# Patient Record
Sex: Female | Born: 1995 | Race: Black or African American | Hispanic: No | Marital: Single | Smoking: Never smoker
Health system: Southern US, Community
[De-identification: ages and names within clinical notes are randomized; demographics above are authoritative.]

## PROBLEM LIST (undated history)

## (undated) ENCOUNTER — Inpatient Hospital Stay (HOSPITAL_COMMUNITY): Payer: Self-pay

---

## 2021-05-12 LAB — OB RESULTS CONSOLE GBS: GBS: NEGATIVE

## 2021-05-12 LAB — OB RESULTS CONSOLE RPR: RPR: NONREACTIVE

## 2021-05-12 LAB — OB RESULTS CONSOLE HEPATITIS B SURFACE ANTIGEN: Hepatitis B Surface Ag: NEGATIVE

## 2021-05-12 LAB — OB RESULTS CONSOLE RUBELLA ANTIBODY, IGM: Rubella: IMMUNE

## 2021-05-12 LAB — OB RESULTS CONSOLE HIV ANTIBODY (ROUTINE TESTING): HIV: NONREACTIVE

## 2021-05-12 LAB — HIV-1 RNA, QUALITATIVE, TMA: HIV-1 RNA, Qualitative, TMA: NEGATIVE

## 2021-06-25 ENCOUNTER — Emergency Department (HOSPITAL_COMMUNITY): Payer: Commercial Managed Care - PPO

## 2021-06-25 ENCOUNTER — Emergency Department (HOSPITAL_COMMUNITY)
Admission: EM | Admit: 2021-06-25 | Discharge: 2021-06-26 | Disposition: A | Discharge status: Home / Self Care | Payer: Commercial Managed Care - PPO | Attending: Emergency Medicine | Admitting: Emergency Medicine

## 2021-06-25 ENCOUNTER — Encounter (HOSPITAL_COMMUNITY): Payer: Self-pay

## 2021-06-25 ENCOUNTER — Other Ambulatory Visit: Payer: Self-pay

## 2021-06-25 DIAGNOSIS — O4690 Antepartum hemorrhage, unspecified, unspecified trimester: Secondary | ICD-10-CM

## 2021-06-25 DIAGNOSIS — N939 Abnormal uterine and vaginal bleeding, unspecified: Secondary | ICD-10-CM | POA: Diagnosis not present

## 2021-06-25 DIAGNOSIS — N898 Other specified noninflammatory disorders of vagina: Secondary | ICD-10-CM | POA: Diagnosis not present

## 2021-06-25 DIAGNOSIS — Z3A01 Less than 8 weeks gestation of pregnancy: Secondary | ICD-10-CM | POA: Insufficient documentation

## 2021-06-25 DIAGNOSIS — O26891 Other specified pregnancy related conditions, first trimester: Secondary | ICD-10-CM | POA: Diagnosis present

## 2021-06-25 LAB — BASIC METABOLIC PANEL
Anion gap: 9 (ref 5–15)
BUN: 9 mg/dL (ref 6–20)
CO2: 23 mmol/L (ref 22–32)
Calcium: 9.5 mg/dL (ref 8.9–10.3)
Chloride: 105 mmol/L (ref 98–111)
Creatinine, Ser: 0.47 mg/dL (ref 0.44–1.00)
GFR, Estimated: >60 mL/min (ref >60)
Glucose, Bld: 95 mg/dL (ref 70–99)
Potassium: 3.7 mmol/L (ref 3.5–5.1)
Sodium: 137 mmol/L (ref 135–145)

## 2021-06-25 LAB — CBC WITH DIFFERENTIAL/PLATELET
Abs Immature Granulocytes: 0.02 10*3/uL (ref 0.00–0.07)
Basophils Absolute: 0 10*3/uL (ref 0.0–0.1)
Basophils Relative: 0 %
Eosinophils Absolute: 0.1 10*3/uL (ref 0.0–0.5)
Eosinophils Relative: 1 %
HCT: 34.8 % — ABNORMAL LOW (ref 36.0–46.0)
Hemoglobin: 11.7 g/dL — ABNORMAL LOW (ref 12.0–15.0)
Immature Granulocytes: 0 %
Lymphocytes Relative: 32 %
Lymphs Abs: 2.8 10*3/uL (ref 0.7–4.0)
MCH: 30.9 pg (ref 26.0–34.0)
MCHC: 33.6 g/dL (ref 30.0–36.0)
MCV: 91.8 fL (ref 80.0–100.0)
Monocytes Absolute: 0.5 10*3/uL (ref 0.1–1.0)
Monocytes Relative: 6 %
Neutro Abs: 5.2 10*3/uL (ref 1.7–7.7)
Neutrophils Relative %: 61 %
Platelets: 284 10*3/uL (ref 150–400)
RBC: 3.79 MIL/uL — ABNORMAL LOW (ref 3.87–5.11)
RDW: 12.8 % (ref 11.5–15.5)
WBC: 8.6 10*3/uL (ref 4.0–10.5)
nRBC: 0 % (ref 0.0–0.2)

## 2021-06-25 LAB — HCG, QUANTITATIVE, PREGNANCY: hCG, Beta Chain, Quant, S: 23202 m[IU]/mL — ABNORMAL HIGH (ref <5)

## 2021-06-25 LAB — PREGNANCY, URINE: Preg Test, Ur: POSITIVE — AB

## 2021-06-25 NOTE — ED Triage Notes (Signed)
Pt reports being [redacted] weeks pregnant with bright red bleeding today when she went to the bathroom. Pt reports having minimal cramping and back pain.

## 2021-06-25 NOTE — ED Provider Notes (Signed)
Emergency Medicine Provider Triage Evaluation Note  [facility] , a 25 y.o. female  was evaluated in triage.  Pt complains of vaginal bleeding and pelvic pain.  Patient reports she is approximately [redacted] weeks pregnant and today began experiencing vaginal bleeding that started as spotting but became heavier throughout the day associated with cramping in her lower abdomen and low back.  Reports history of 1 prior miscarriage which felt very similar.  No lightheadedness or syncope.  Review of Systems  Positive: Vaginal bleeding, abdominal pain, pelvic pain Negative: Fever, syncope  Physical Exam  BP 129/77 (BP Location: Left Arm)   Pulse 69   Temp 98.2 F (36.8 C) (Oral)   Resp 16   Ht 5' 6.5" (1.689 m)   Wt 84.4 kg   SpO2 100%   BMI 29.57 kg/m  Gen:   Awake, no distress   Resp:  Normal effort  MSK:   Moves extremities without difficulty  Other:    Medical Decision Making  Medically screening exam initiated at 8:27 PM.  Appropriate orders placed.  Robin Nelson was informed that the remainder of the evaluation will be completed by another provider, this initial triage assessment does not replace that evaluation, and the importance of remaining in the ED until their evaluation is complete.     Legrand Rams 06/25/21 2042    Wynetta Fines, MD 06/27/21 1452

## 2021-06-26 LAB — URINALYSIS, ROUTINE W REFLEX MICROSCOPIC
Bilirubin Urine: NEGATIVE
Glucose, UA: NEGATIVE mg/dL
Hgb urine dipstick: NEGATIVE
Ketones, ur: NEGATIVE mg/dL
Leukocytes,Ua: NEGATIVE
Nitrite: NEGATIVE
Protein, ur: NEGATIVE mg/dL
Specific Gravity, Urine: 1.02 (ref 1.005–1.030)
pH: 5 (ref 5.0–8.0)

## 2021-06-26 LAB — ABO/RH: ABO/RH(D): B POS

## 2021-06-26 NOTE — Discharge Instructions (Addendum)
Your urinalysis did not suggest a urinary tract infection.  Your ultrasound showed an intrauterine pregnancy of 6 weeks and 1 day.  A fetal heart rate was present.  We recommend that you continue taking a prenatal vitamin and schedule follow-up with an OB/GYN for ongoing prenatal care.  Avoid the use of NSAIDs in pregnancy such as Advil, Motrin, Aleve.  You may use Tylenol for management of any abdominal cramping.   Return to the ED for new or concerning symptoms.

## 2021-06-26 NOTE — ED Provider Notes (Signed)
[facility]-EMERGENCY DEPT Provider Note   CSN: [id number] Arrival date & time: 06/25/21  1934     History Chief Complaint  Patient presents with   Threatened Miscarriage    Elba Schaber is a 25 y.o. female.  25 year old G25P0 female with LMP of 05/12/2021 presents to the emergency department for evaluation of abdominal cramping.  Cramping present in her lower abdomen and back.  Symptoms noted to be constant, but have been spontaneously improving.  During my assessment, the patient reports that her discomfort has completely resolved.  She did notice a scant amount of vaginal discharge characterized as blood mixed with mucus.  Her spotting has been sporadic, noticed mostly when wiping with the toilet tissue.  She has been having some urinary frequency, but no dysuria.  Denies fever, lightheadedness, syncope.  The history is provided by the patient. No language interpreter was used.      History reviewed. No pertinent past medical history.  There are no problems to display for this patient.   ** The histories are not reviewed yet. Please review them in the "History" navigator section and refresh this SmartLink.   OB History   No obstetric history on file.     History reviewed. No pertinent family history.     Home Medications Prior to Admission medications   Not on File    Allergies    Patient has no allergy information on record.  Review of Systems   Review of Systems Ten systems reviewed and are negative for acute change, except as noted in the HPI.    Physical Exam Updated Vital Signs BP 129/77 (BP Location: Left Arm)   Pulse 69   Temp 98.2 F (36.8 C) (Oral)   Resp 16   Ht 5' 6.5" (1.689 m)   Wt 84.4 kg   SpO2 100%   BMI 29.57 kg/m   Physical Exam Vitals and nursing note reviewed.  Constitutional:      General: She is not in acute distress.    Appearance: She is well-developed. She is not diaphoretic.     Comments: Nontoxic  appearing and in NAD  HENT:     Head: Normocephalic and atraumatic.  Eyes:     General: No scleral icterus.    Conjunctiva/sclera: Conjunctivae normal.  Pulmonary:     Effort: Pulmonary effort is normal. No respiratory distress.     Comments: Respirations even and unlabored Abdominal:     General: There is no distension.  Musculoskeletal:        General: Normal range of motion.     Cervical back: Normal range of motion.  Skin:    General: Skin is warm and dry.     Coloration: Skin is not pale.     Findings: No erythema or rash.  Neurological:     Mental Status: She is alert and oriented to person, place, and time.     Coordination: Coordination normal.  Psychiatric:        Behavior: Behavior normal.    ED Results / Procedures / Treatments   Labs (all labs ordered are listed, but only abnormal results are displayed) Labs Reviewed  CBC WITH DIFFERENTIAL/PLATELET - Abnormal; Notable for the following components:      Result Value   RBC 3.79 (*)    Hemoglobin 11.7 (*)    HCT 34.8 (*)    All other components within normal limits  HCG, QUANTITATIVE, PREGNANCY - Abnormal; Notable for the following components:   hCG, Beta Chain,  Quant, S 23,202 (*)    All other components within normal limits  PREGNANCY, URINE - Abnormal; Notable for the following components:   Preg Test, Ur POSITIVE (*)    All other components within normal limits  BASIC METABOLIC PANEL  URINALYSIS, ROUTINE W REFLEX MICROSCOPIC  ABO/RH    EKG None  Radiology US OB Comp < 14 Wks  Result Date: 06/26/2021 CLINICAL DATA:  Pregnant, vaginal bleeding, LMP 05/12/2021 EXAM: OBSTETRIC <14 WK [place] AND TRANSVAGINAL OB US TECHNIQUE: Both transabdominal and transvaginal ultrasound examinations were performed for complete evaluation of the gestation as well as the maternal uterus, adnexal regions, and pelvic cul-de-sac. Transvaginal technique was performed to assess early pregnancy. COMPARISON:  None. FINDINGS:  Intrauterine gestational sac: Present, single. The gestational sac is seen within the lateral, superior aspect of the endometrial cavity compatible with an angular pregnancy. Yolk sac:  Present, single, normal appearing Embryo:  Present, single Cardiac Activity: Present, regular Heart Rate: 95 bpm MSD: Appropriate given fetal size CRL:  4 mm   6 w   1 d                  [place] EDC: 02/18/2022 Subchorionic hemorrhage:  None visualized. Maternal uterus/adnexae: The uterus is anteverted. No intrauterine masses are seen. The cervix is not specifically evaluated on on this exam. The left ovary is not visualized. A corpus luteum is identified within the right ovary. No free fluid is seen within the cul-de-sac. IMPRESSION: Single living intrauterine gestation with an estimated gestational age of [redacted] weeks, 1 day. Gestational sac position within the superolateral aspect of the endometrial cavity, which appears normal in configuration, compatible with an angular pregnancy. Electronically Signed   By: Helyn Numbers M.D.   On: 06/26/2021 00:15   US OB Transvaginal  Result Date: 06/26/2021 CLINICAL DATA:  Pregnant, vaginal bleeding, LMP 05/12/2021 EXAM: OBSTETRIC <14 WK [place] AND TRANSVAGINAL OB US TECHNIQUE: Both transabdominal and transvaginal ultrasound examinations were performed for complete evaluation of the gestation as well as the maternal uterus, adnexal regions, and pelvic cul-de-sac. Transvaginal technique was performed to assess early pregnancy. COMPARISON:  None. FINDINGS: Intrauterine gestational sac: Present, single. The gestational sac is seen within the lateral, superior aspect of the endometrial cavity compatible with an angular pregnancy. Yolk sac:  Present, single, normal appearing Embryo:  Present, single Cardiac Activity: Present, regular Heart Rate: 95 bpm MSD: Appropriate given fetal size CRL:  4 mm   6 w   1 d                  [place] EDC: 02/18/2022 Subchorionic hemorrhage:  None visualized. Maternal  uterus/adnexae: The uterus is anteverted. No intrauterine masses are seen. The cervix is not specifically evaluated on on this exam. The left ovary is not visualized. A corpus luteum is identified within the right ovary. No free fluid is seen within the cul-de-sac. IMPRESSION: Single living intrauterine gestation with an estimated gestational age of [redacted] weeks, 1 day. Gestational sac position within the superolateral aspect of the endometrial cavity, which appears normal in configuration, compatible with an angular pregnancy. Electronically Signed   By: Helyn Numbers M.D.   On: 06/26/2021 00:15    Procedures Procedures   Medications Ordered in ED Medications - No data to display  ED Course  I have reviewed the triage vital signs and the nursing notes.  Pertinent labs & imaging results that were available during my care of the patient were reviewed by me and considered  in my medical decision making (see chart for details).    MDM Rules/Calculators/A&P                           25 year old female with vaginal discharge in early pregnancy.  Discharge characterized as mucus mixed with some bright red blood.  Also noting some lower abdominal cramping which has spontaneously resolved.  Her laboratory evaluation has been reassuring and pelvic ultrasound shows single live intrauterine gestation of approximately 6 weeks, 1 day.  Fetal heart rate noted.  Encouraged close OBGYN for prenatal care.  Return precautions discussed and provided. Patient discharged in stable condition with no unaddressed concerns.   Final Clinical Impression(s) / ED Diagnoses Final diagnoses:  Vaginal bleeding in pregnancy    Rx / DC Orders ED Discharge Orders     None        Antony Madura, PA-C 06/26/21 0136    Palumbo, April, MD 06/26/21 [phone]

## 2021-09-16 ENCOUNTER — Other Ambulatory Visit: Payer: Self-pay

## 2021-09-16 ENCOUNTER — Emergency Department (HOSPITAL_COMMUNITY)
Admission: EM | Admit: 2021-09-16 | Discharge: 2021-09-17 | Disposition: A | Discharge status: Home / Self Care | Payer: Commercial Managed Care - PPO | Attending: Emergency Medicine | Admitting: Emergency Medicine

## 2021-09-16 DIAGNOSIS — Z3A18 18 weeks gestation of pregnancy: Secondary | ICD-10-CM | POA: Diagnosis not present

## 2021-09-16 DIAGNOSIS — O26892 Other specified pregnancy related conditions, second trimester: Secondary | ICD-10-CM | POA: Diagnosis present

## 2021-09-16 DIAGNOSIS — R42 Dizziness and giddiness: Secondary | ICD-10-CM | POA: Insufficient documentation

## 2021-09-16 DIAGNOSIS — Z20822 Contact with and (suspected) exposure to covid-19: Secondary | ICD-10-CM | POA: Diagnosis not present

## 2021-09-16 LAB — CBC WITH DIFFERENTIAL/PLATELET
Abs Immature Granulocytes: 0.04 10*3/uL (ref 0.00–0.07)
Basophils Absolute: 0 10*3/uL (ref 0.0–0.1)
Basophils Relative: 0 %
Eosinophils Absolute: 0 10*3/uL (ref 0.0–0.5)
Eosinophils Relative: 1 %
HCT: 28.4 % — ABNORMAL LOW (ref 36.0–46.0)
Hemoglobin: 9.7 g/dL — ABNORMAL LOW (ref 12.0–15.0)
Immature Granulocytes: 1 %
Lymphocytes Relative: 25 %
Lymphs Abs: 1.7 10*3/uL (ref 0.7–4.0)
MCH: 30.7 pg (ref 26.0–34.0)
MCHC: 34.2 g/dL (ref 30.0–36.0)
MCV: 89.9 fL (ref 80.0–100.0)
Monocytes Absolute: 0.5 10*3/uL (ref 0.1–1.0)
Monocytes Relative: 7 %
Neutro Abs: 4.7 10*3/uL (ref 1.7–7.7)
Neutrophils Relative %: 66 %
Platelets: 211 10*3/uL (ref 150–400)
RBC: 3.16 MIL/uL — ABNORMAL LOW (ref 3.87–5.11)
RDW: 13.4 % (ref 11.5–15.5)
WBC: 7 10*3/uL (ref 4.0–10.5)
nRBC: 0 % (ref 0.0–0.2)

## 2021-09-16 NOTE — ED Triage Notes (Signed)
Pt complains of dizziness for the past few days. Pt reports being [redacted] weeks pregnant.

## 2021-09-16 NOTE — ED Provider Notes (Signed)
Emergency Medicine Provider Triage Evaluation Note  [facility] , a 25 y.o. female  was evaluated in triage.  Pt complains of dizziness described as feeling like her body is moving, occurs while at rest as well as with activity. No recent falls or injuries. Reports urinary frequency without dysuria, attributes frequency to pregnancy (18 weeks, no leaking fluids or bleeding). OB confirmed pregnancy, normal [place] with IUP. G2P1 (blighted ovum at 7 wks with first pregnancy).  Review of Systems  Positive: dizziness Negative: Vomiting, changes in bowel habits, weakness, numbness   Physical Exam  BP 122/81   Pulse 88   Temp 98.9 F (37.2 C) (Oral)   Resp 18   SpO2 100%  Gen:   Awake, no distress   Resp:  Normal effort  MSK:   Moves extremities without difficulty  Other:  Negative Romberg, neuro exam normal  Medical Decision Making  Medically screening exam initiated at 10:28 PM.  Appropriate orders placed.  Miabella Shannahan was informed that the remainder of the evaluation will be completed by another provider, this initial triage assessment does not replace that evaluation, and the importance of remaining in the ED until their evaluation is complete.     Jeannie Fend, PA-C 09/19/21 1734    Mancel Bale, MD 09/22/21 [phone]

## 2021-09-17 LAB — URINALYSIS, ROUTINE W REFLEX MICROSCOPIC
Bilirubin Urine: NEGATIVE
Glucose, UA: NEGATIVE mg/dL
Hgb urine dipstick: NEGATIVE
Ketones, ur: 5 mg/dL — AB
Leukocytes,Ua: NEGATIVE
Nitrite: NEGATIVE
Protein, ur: NEGATIVE mg/dL
Specific Gravity, Urine: 1.024 (ref 1.005–1.030)
pH: 6 (ref 5.0–8.0)

## 2021-09-17 LAB — COMPREHENSIVE METABOLIC PANEL
ALT: 24 U/L (ref 0–44)
AST: 19 U/L (ref 15–41)
Albumin: 3.6 g/dL (ref 3.5–5.0)
Alkaline Phosphatase: 58 U/L (ref 38–126)
Anion gap: 7 (ref 5–15)
BUN: 9 mg/dL (ref 6–20)
CO2: 21 mmol/L — ABNORMAL LOW (ref 22–32)
Calcium: 9.2 mg/dL (ref 8.9–10.3)
Chloride: 107 mmol/L (ref 98–111)
Creatinine, Ser: 0.37 mg/dL — ABNORMAL LOW (ref 0.44–1.00)
GFR, Estimated: >60 mL/min (ref >60)
Glucose, Bld: 87 mg/dL (ref 70–99)
Potassium: 3.6 mmol/L (ref 3.5–5.1)
Sodium: 135 mmol/L (ref 135–145)
Total Bilirubin: 0.3 mg/dL (ref 0.3–1.2)
Total Protein: 7 g/dL (ref 6.5–8.1)

## 2021-09-17 LAB — RESP PANEL BY RT-PCR (FLU A&B, COVID) ARPGX2
Influenza A by PCR: NEGATIVE
Influenza B by PCR: NEGATIVE
SARS Coronavirus 2 by RT PCR: NEGATIVE

## 2021-09-17 NOTE — ED Provider Notes (Signed)
[facility] DEPT Provider Note   CSN: [id number] Arrival date & time: 09/16/21  2113     History Chief Complaint  Patient presents with   Dizziness    Robin Nelson is a 25 y.o. female.  Patient who is [redacted] weeks pregnant presents the emergency department today for evaluation of dizziness.  She states that she has been dizzy, described as a lightheaded sensation and a internal restless sensation for the past several days.  This occurs at rest, lying, with standing.  Symptoms were worse last night prompting ED visit.  She denies any abdominal pain, pubic pain, back pain.  No vaginal bleeding or discharge of any kind.  She still does feel fetal movement.  No syncope, chest pain or shortness of breath.  No lower extremity swelling.  No complications reported thus far during pregnancy. The onset of this condition was acute. The course is constant. Aggravating factors: none. Alleviating factors: none.        No past medical history on file.  There are no problems to display for this patient.   The histories are not reviewed yet. Please review them in the "History" navigator section and refresh this [place].   OB History   No obstetric history on file.     No family history on file.     Home Medications Prior to Admission medications   Not on File    Allergies    Patient has no known allergies.  Review of Systems   Review of Systems  Constitutional:  Negative for fever.  HENT:  Negative for rhinorrhea and sore throat.   Eyes:  Negative for redness.  Respiratory:  Negative for cough.   Cardiovascular:  Negative for chest pain.  Gastrointestinal:  Negative for abdominal pain, diarrhea, nausea and vomiting.  Genitourinary:  Negative for dysuria, frequency, hematuria, urgency, vaginal bleeding and vaginal discharge.  Musculoskeletal:  Negative for myalgias.  Skin:  Negative for rash.  Neurological:  Positive for dizziness and  light-headedness. Negative for headaches.   Physical Exam Updated Vital Signs BP 117/69   Pulse (!) 54   Temp 98.9 F (37.2 C) (Oral)   Resp 18   SpO2 100%   Physical Exam Vitals and nursing note reviewed.  Constitutional:      General: She is not in acute distress.    Appearance: She is well-developed.  HENT:     Head: Normocephalic and atraumatic.     Right Ear: External ear normal.     Left Ear: External ear normal.     Nose: Nose normal.  Eyes:     Conjunctiva/sclera: Conjunctivae normal.  Cardiovascular:     Rate and Rhythm: Normal rate and regular rhythm.     Heart sounds: No murmur heard. Pulmonary:     Effort: No respiratory distress.     Breath sounds: No wheezing, rhonchi or rales.  Abdominal:     General: There is distension.     Palpations: Abdomen is soft.     Tenderness: There is no abdominal tenderness. There is no guarding or rebound.     Comments: Consistent with dates.  Musculoskeletal:     Cervical back: Normal range of motion and neck supple.     Right lower leg: No edema.     Left lower leg: No edema.  Skin:    General: Skin is warm and dry.     Findings: No rash.  Neurological:     General: No focal deficit present.  Mental Status: She is alert. Mental status is at baseline.     Motor: No weakness.  Psychiatric:        Mood and Affect: Mood normal.    ED Results / Procedures / Treatments   Labs (all labs ordered are listed, but only abnormal results are displayed) Labs Reviewed  COMPREHENSIVE METABOLIC PANEL - Abnormal; Notable for the following components:      Result Value   CO2 21 (*)    Creatinine, Ser 0.37 (*)    All other components within normal limits  CBC WITH DIFFERENTIAL/PLATELET - Abnormal; Notable for the following components:   RBC 3.16 (*)    Hemoglobin 9.7 (*)    HCT 28.4 (*)    All other components within normal limits  RESP PANEL BY RT-PCR (FLU A&B, COVID) ARPGX2  URINALYSIS, ROUTINE W REFLEX MICROSCOPIC     EKG None  Radiology No results found.  Procedures Procedures   Medications Ordered in ED Medications - No data to display  ED Course  I have reviewed the triage vital signs and the nursing notes.  Pertinent labs & imaging results that were available during my care of the patient were reviewed by me and considered in my medical decision making (see chart for details).  Patient seen and examined. Work-up reviewed.  Added EKG due to lightheadedness.  Awaiting UA.  Will get orthostatic vital signs.  Vital signs reviewed and are as follows: BP 117/69   Pulse (!) 54   Temp 98.9 F (37.2 C) (Oral)   Resp 18   SpO2 100%   EMERGENCY DEPARTMENT US PREGNANCY "Study: Limited Ultrasound of the Pelvis for Pregnancy"  INDICATIONS:Pregnancy(required) and dizziness Multiple views of the uterus and pelvic cavity were obtained in real-time with a multi-frequency probe.  APPROACH:Transabdominal  PERFORMED BY: Myself IMAGES ARCHIVED?: No LIMITATIONS: none PREGNANCY FREE FLUID: None ADNEXAL FINDINGS:not evaluated GESTATIONAL AGE, ESTIMATE: not measured FETAL HEART RATE: 150 INTERPRETATION: Intrauterine gestational sac noted and Fetal heart activity seen with appropriate fetal movement   ED ECG REPORT   Date: 09/17/2021  Rate: 55  Rhythm: sinus bradycardia  QRS Axis: normal  Intervals: normal  ST/T Wave abnormalities: normal  Conduction Disutrbances:none  Narrative Interpretation:   Old EKG Reviewed: none available  I have personally reviewed the EKG tracing and agree with the computerized printout as noted.  Orthostatic VS for the past 24 hrs:  BP- Lying Pulse- Lying BP- Sitting Pulse- Sitting BP- Standing at 0 minutes Pulse- Standing at 0 minutes  09/17/21 0748 113/64 56 116/65 65 116/70 70   8:08 AM Plan to d/c.  Encouraged rest, hydration.  Encouraged return to the emergency department with severe lightheadedness, passing out, chest pain, shortness of breath, vaginal  bleeding or discharge, or other concerns.    MDM Rules/Calculators/A&P                           Patient with lightheadedness in setting of second trimester pregnancy.  Fetal heart tones normal.  No abdominal pain, labor pains, vaginal bleeding or discharge.  EKG without concerning findings.  She has not particularly orthostatic.  Symptoms do seem somewhat positional.  Suspect related to pregnancy but no concerning findings such as PE, ACS.  Vital signs are reassuring.   Final Clinical Impression(s) / ED Diagnoses Final diagnoses:  Lightheadedness  [redacted] weeks gestation of pregnancy    Rx / DC Orders ED Discharge Orders     None  Renne Crigler, PA-C 09/17/21 0810    Virgina Norfolk, DO 09/17/21 [phone]

## 2021-09-17 NOTE — Discharge Instructions (Signed)
Please read and follow all provided instructions.  Your diagnoses today include:  1. Lightheadedness   2. [redacted] weeks gestation of pregnancy     Tests performed today include: Complete blood cell count: shows anemia Complete metabolic panel: no problems Urinalysis (urine test): Flu testing: was negative Vital signs. See below for your results today.   Medications prescribed:  None  Take any prescribed medications only as directed.  Home care instructions:  Follow any educational materials contained in this packet.  BE VERY CAREFUL not to take multiple medicines containing Tylenol (also called acetaminophen). Doing so can lead to an overdose which can damage your liver and cause liver failure and possibly death.   Follow-up instructions: Please follow-up with your primary care provider in the next 3 days for further evaluation of your symptoms.   Return instructions:  Please return to the Emergency Department if you experience worsening symptoms.  Return if you develop chest, abdominal, or back pain or have episodes of passing out, vaginal bleeding or discharge. Please return if you have any other emergent concerns.  Additional Information:  Your vital signs today were: BP 116/65   Pulse (!) 55   Temp 98.9 F (37.2 C) (Oral)   Resp 19   SpO2 100%  If your blood pressure (BP) was elevated above 135/85 this visit, please have this repeated by your doctor within one month. --------------

## 2021-11-04 NOTE — L&D Delivery Note (Signed)
Delivery Note ?At 4:11 PM a viable and healthy female was delivered via Vaginal, Spontaneous (Presentation:   Occiput Anterior).  APGAR: 9, 9; weight  pending.   ?Placenta status: Spontaneous, Intact.  Cord:  right leg cord x 1 3 vessels with the following complications: None.  Cord pH: n/a ? ?Anesthesia: Epidural ?Episiotomy: None ?Lacerations: 2nd degree;Perineal, left vaginal ?Suture Repair: 3.0 chromic ?Est. Blood Loss (mL): 150 ? ?Mom to postpartum.  Baby to Couplet care / Skin to Skin. ? ?Robin Nelson ?02/09/2022, 4:45 PM ? ? ? ?

## 2022-02-09 ENCOUNTER — Inpatient Hospital Stay (HOSPITAL_COMMUNITY)
Admission: AD | Admit: 2022-02-09 | Discharge: 2022-02-11 | DRG: 807 | Disposition: A | Discharge status: Home / Self Care | Payer: Commercial Managed Care - PPO | Attending: Obstetrics and Gynecology | Admitting: Obstetrics and Gynecology

## 2022-02-09 ENCOUNTER — Other Ambulatory Visit: Payer: Self-pay

## 2022-02-09 ENCOUNTER — Inpatient Hospital Stay (HOSPITAL_COMMUNITY): Payer: Commercial Managed Care - PPO | Admitting: Anesthesiology

## 2022-02-09 ENCOUNTER — Encounter (HOSPITAL_COMMUNITY): Payer: Self-pay | Admitting: Obstetrics & Gynecology

## 2022-02-09 DIAGNOSIS — O26893 Other specified pregnancy related conditions, third trimester: Secondary | ICD-10-CM | POA: Diagnosis present

## 2022-02-09 DIAGNOSIS — O9902 Anemia complicating childbirth: Secondary | ICD-10-CM | POA: Diagnosis present

## 2022-02-09 DIAGNOSIS — Z3A39 39 weeks gestation of pregnancy: Secondary | ICD-10-CM

## 2022-02-09 DIAGNOSIS — D509 Iron deficiency anemia, unspecified: Secondary | ICD-10-CM | POA: Diagnosis present

## 2022-02-09 LAB — TYPE AND SCREEN
ABO/RH(D): B POS
Antibody Screen: NEGATIVE

## 2022-02-09 LAB — CBC
HCT: 30.7 % — ABNORMAL LOW (ref 36.0–46.0)
HCT: 28.9 % — ABNORMAL LOW (ref 36.0–46.0)
Hemoglobin: 10.2 g/dL — ABNORMAL LOW (ref 12.0–15.0)
Hemoglobin: 9.8 g/dL — ABNORMAL LOW (ref 12.0–15.0)
MCH: 29.1 pg (ref 26.0–34.0)
MCH: 29.3 pg (ref 26.0–34.0)
MCHC: 33.2 g/dL (ref 30.0–36.0)
MCHC: 33.9 g/dL (ref 30.0–36.0)
MCV: 87.5 fL (ref 80.0–100.0)
MCV: 86.5 fL (ref 80.0–100.0)
Platelets: 225 10*3/uL (ref 150–400)
Platelets: 198 10*3/uL (ref 150–400)
RBC: 3.51 MIL/uL — ABNORMAL LOW (ref 3.87–5.11)
RBC: 3.34 MIL/uL — ABNORMAL LOW (ref 3.87–5.11)
RDW: 13.2 % (ref 11.5–15.5)
RDW: 13.2 % (ref 11.5–15.5)
WBC: 6.3 10*3/uL (ref 4.0–10.5)
WBC: 10.6 10*3/uL — ABNORMAL HIGH (ref 4.0–10.5)
nRBC: 0 % (ref 0.0–0.2)
nRBC: 0 % (ref 0.0–0.2)

## 2022-02-09 LAB — COMPREHENSIVE METABOLIC PANEL
ALT: 9 U/L (ref 0–44)
AST: 23 U/L (ref 15–41)
Albumin: 2.7 g/dL — ABNORMAL LOW (ref 3.5–5.0)
Alkaline Phosphatase: 98 U/L (ref 38–126)
Anion gap: 9 (ref 5–15)
BUN: 6 mg/dL (ref 6–20)
CO2: 21 mmol/L — ABNORMAL LOW (ref 22–32)
Calcium: 8.9 mg/dL (ref 8.9–10.3)
Chloride: 106 mmol/L (ref 98–111)
Creatinine, Ser: 0.66 mg/dL (ref 0.44–1.00)
GFR, Estimated: >60 mL/min (ref >60)
Glucose, Bld: 98 mg/dL (ref 70–99)
Potassium: 3.8 mmol/L (ref 3.5–5.1)
Sodium: 136 mmol/L (ref 135–145)
Total Bilirubin: 0.8 mg/dL (ref 0.3–1.2)
Total Protein: 5.8 g/dL — ABNORMAL LOW (ref 6.5–8.1)

## 2022-02-09 LAB — POCT FERN TEST: POCT Fern Test: POSITIVE

## 2022-02-09 LAB — RPR: RPR Ser Ql: NONREACTIVE

## 2022-02-09 LAB — URIC ACID: Uric Acid, Serum: 4.5 mg/dL (ref 2.5–7.1)

## 2022-02-09 MED ORDER — ZOLPIDEM TARTRATE 5 MG PO TABS: 5.0000 mg | ORAL_TABLET | Freq: Every evening | ORAL | Status: DC | PRN

## 2022-02-09 MED ORDER — DIBUCAINE (PERIANAL) 1 % EX OINT: 1.0000 "application " | TOPICAL_OINTMENT | CUTANEOUS | Status: DC | PRN

## 2022-02-09 MED ORDER — SIMETHICONE 80 MG PO CHEW: 80.0000 mg | CHEWABLE_TABLET | ORAL | Status: DC | PRN

## 2022-02-09 MED ORDER — LIDOCAINE HCL (PF) 1 % IJ SOLN
INTRAMUSCULAR | Status: DC | PRN
  Administered 2022-02-09: 8 mL via EPIDURAL

## 2022-02-09 MED ORDER — TERBUTALINE SULFATE 1 MG/ML IJ SOLN: 0.2500 mg | Freq: Once | INTRAMUSCULAR | Status: DC | PRN

## 2022-02-09 MED ORDER — FLEET ENEMA 7-19 GM/118ML RE ENEM: 1.0000 | ENEMA | RECTAL | Status: DC | PRN

## 2022-02-09 MED ORDER — PHENYLEPHRINE 40 MCG/ML (10ML) SYRINGE FOR IV PUSH (FOR BLOOD PRESSURE SUPPORT): 80.0000 ug | PREFILLED_SYRINGE | INTRAVENOUS | Status: DC | PRN

## 2022-02-09 MED ORDER — FENTANYL CITRATE (PF) 100 MCG/2ML IJ SOLN: 50.0000 ug | INTRAMUSCULAR | Status: DC | PRN

## 2022-02-09 MED ORDER — EPHEDRINE 5 MG/ML INJ: 10.0000 mg | INTRAVENOUS | Status: DC | PRN

## 2022-02-09 MED ORDER — OXYTOCIN BOLUS FROM INFUSION
333.0000 mL | Freq: Once | INTRAVENOUS | Status: AC
  Administered 2022-02-09: 333 mL via INTRAVENOUS

## 2022-02-09 MED ORDER — COCONUT OIL OIL: 1.0000 "application " | TOPICAL_OIL | Status: DC | PRN

## 2022-02-09 MED ORDER — ONDANSETRON HCL 4 MG PO TABS: 4.0000 mg | ORAL_TABLET | ORAL | Status: DC | PRN

## 2022-02-09 MED ORDER — FERROUS SULFATE 325 (65 FE) MG PO TABS
325.0000 mg | ORAL_TABLET | Freq: Two times a day (BID) | ORAL | Status: DC
  Administered 2022-02-10 – 2022-02-11 (×3): 325 mg via ORAL
  Filled 2022-02-09 (×3): qty 1

## 2022-02-09 MED ORDER — OXYTOCIN-SODIUM CHLORIDE 30-0.9 UT/500ML-% IV SOLN
1.0000 m[IU]/min | INTRAVENOUS | Status: DC
  Administered 2022-02-09: 2 m[IU]/min via INTRAVENOUS
  Filled 2022-02-09: qty 500

## 2022-02-09 MED ORDER — BENZOCAINE-MENTHOL 20-0.5 % EX AERO
1.0000 "application " | INHALATION_SPRAY | CUTANEOUS | Status: DC | PRN
  Administered 2022-02-10: 1 via TOPICAL
  Filled 2022-02-09: qty 56

## 2022-02-09 MED ORDER — OXYCODONE HCL 5 MG PO TABS: 10.0000 mg | ORAL_TABLET | ORAL | Status: DC | PRN

## 2022-02-09 MED ORDER — ACETAMINOPHEN 325 MG PO TABS
650.0000 mg | ORAL_TABLET | ORAL | Status: DC | PRN
  Administered 2022-02-09 – 2022-02-11 (×2): 650 mg via ORAL
  Filled 2022-02-09 (×3): qty 2

## 2022-02-09 MED ORDER — OXYCODONE-ACETAMINOPHEN 5-325 MG PO TABS: 2.0000 | ORAL_TABLET | ORAL | Status: DC | PRN

## 2022-02-09 MED ORDER — OXYCODONE HCL 5 MG PO TABS
5.0000 mg | ORAL_TABLET | ORAL | Status: DC | PRN
  Administered 2022-02-11 (×2): 5 mg via ORAL
  Filled 2022-02-09 (×2): qty 1

## 2022-02-09 MED ORDER — DIPHENHYDRAMINE HCL 25 MG PO CAPS: 25.0000 mg | ORAL_CAPSULE | Freq: Four times a day (QID) | ORAL | Status: DC | PRN

## 2022-02-09 MED ORDER — IBUPROFEN 600 MG PO TABS
600.0000 mg | ORAL_TABLET | Freq: Four times a day (QID) | ORAL | Status: DC
  Administered 2022-02-09 – 2022-02-11 (×7): 600 mg via ORAL
  Filled 2022-02-09 (×7): qty 1

## 2022-02-09 MED ORDER — ONDANSETRON HCL 4 MG/2ML IJ SOLN: 4.0000 mg | Freq: Four times a day (QID) | INTRAMUSCULAR | Status: DC | PRN

## 2022-02-09 MED ORDER — DIPHENHYDRAMINE HCL 50 MG/ML IJ SOLN: 12.5000 mg | INTRAMUSCULAR | Status: DC | PRN

## 2022-02-09 MED ORDER — FENTANYL-BUPIVACAINE-NACL 0.5-0.125-0.9 MG/250ML-% EP SOLN
EPIDURAL | Status: AC
  Filled 2022-02-09: qty 250

## 2022-02-09 MED ORDER — LACTATED RINGERS IV SOLN: INTRAVENOUS | Status: DC

## 2022-02-09 MED ORDER — OXYTOCIN-SODIUM CHLORIDE 30-0.9 UT/500ML-% IV SOLN: 2.5000 [IU]/h | INTRAVENOUS | Status: DC

## 2022-02-09 MED ORDER — SENNOSIDES-DOCUSATE SODIUM 8.6-50 MG PO TABS
2.0000 | ORAL_TABLET | Freq: Every day | ORAL | Status: DC
  Administered 2022-02-10 – 2022-02-11 (×2): 2 via ORAL
  Filled 2022-02-09 (×2): qty 2

## 2022-02-09 MED ORDER — ONDANSETRON HCL 4 MG/2ML IJ SOLN: 4.0000 mg | INTRAMUSCULAR | Status: DC | PRN

## 2022-02-09 MED ORDER — SOD CITRATE-CITRIC ACID 500-334 MG/5ML PO SOLN: 30.0000 mL | ORAL | Status: DC | PRN

## 2022-02-09 MED ORDER — ACETAMINOPHEN 325 MG PO TABS: 650.0000 mg | ORAL_TABLET | ORAL | Status: DC | PRN

## 2022-02-09 MED ORDER — OXYCODONE-ACETAMINOPHEN 5-325 MG PO TABS: 1.0000 | ORAL_TABLET | ORAL | Status: DC | PRN

## 2022-02-09 MED ORDER — FENTANYL-BUPIVACAINE-NACL 0.5-0.125-0.9 MG/250ML-% EP SOLN
12.0000 mL/h | EPIDURAL | Status: DC | PRN
  Administered 2022-02-09: 12 mL/h via EPIDURAL

## 2022-02-09 MED ORDER — LACTATED RINGERS IV SOLN
500.0000 mL | Freq: Once | INTRAVENOUS | Status: AC
  Administered 2022-02-09: 500 mL via INTRAVENOUS

## 2022-02-09 MED ORDER — LACTATED RINGERS IV SOLN: 500.0000 mL | INTRAVENOUS | Status: DC | PRN

## 2022-02-09 MED ORDER — LIDOCAINE HCL (PF) 1 % IJ SOLN: 30.0000 mL | INTRAMUSCULAR | Status: DC | PRN

## 2022-02-09 MED ORDER — WITCH HAZEL-GLYCERIN EX PADS: 1.0000 "application " | MEDICATED_PAD | CUTANEOUS | Status: DC | PRN

## 2022-02-09 MED ORDER — PRENATAL MULTIVITAMIN CH
1.0000 | ORAL_TABLET | Freq: Every day | ORAL | Status: DC
  Administered 2022-02-10 – 2022-02-11 (×2): 1 via ORAL
  Filled 2022-02-09 (×2): qty 1

## 2022-02-09 NOTE — Anesthesia Preprocedure Evaluation (Signed)
Anesthesia Evaluation  ?Patient identified by MRN, date of birth, ID band ?Patient awake ? ? ? ?Reviewed: ?Allergy & Precautions, H&P , NPO status , Patient's Chart, lab work & pertinent test results, reviewed documented beta blocker date and time  ? ?Airway ?Mallampati: II ? ?TM Distance: >3 FB ?Neck ROM: full ? ? ? Dental ?no notable dental hx. ? ?  ?Pulmonary ?neg pulmonary ROS,  ?  ?Pulmonary exam normal ?breath sounds clear to auscultation ? ? ? ? ? ? Cardiovascular ?negative cardio ROS ?Normal cardiovascular exam ?Rhythm:regular Rate:Normal ? ? ?  ?Neuro/Psych ?negative neurological ROS ? negative psych ROS  ? GI/Hepatic ?negative GI ROS, Neg liver ROS,   ?Endo/Other  ?negative endocrine ROS ? Renal/GU ?negative Renal ROS  ?negative genitourinary ?  ?Musculoskeletal ? ? Abdominal ?  ?Peds ? Hematology ?negative hematology ROS ?(+)   ?Anesthesia Other Findings ? ? Reproductive/Obstetrics ?(+) Pregnancy ? ?  ? ? ? ? ? ? ? ? ? ? ? ? ? ?  ?  ? ? ? ? ? ? ? ? ?Anesthesia Physical ?Anesthesia Plan ? ?ASA: 2 ? ?Anesthesia Plan: Epidural  ? ?Post-op Pain Management:   ? ?Induction:  ? ?PONV Risk Score and Plan: 2 and Treatment may vary due to age or medical condition ? ?Airway Management Planned: Natural Airway ? ?Additional Equipment: None ? ?Intra-op Plan:  ? ?Post-operative Plan:  ? ?Informed Consent: I have reviewed the patients History and Physical, chart, labs and discussed the procedure including the risks, benefits and alternatives for the proposed anesthesia with the patient or authorized representative who has indicated his/her understanding and acceptance.  ? ? ? ?Dental Advisory Given ? ?Plan Discussed with: Anesthesiologist and CRNA ? ?Anesthesia Plan Comments: (Labs checked- platelets confirmed with RN in room. Fetal heart tracing, per RN, reported to be stable enough for sitting procedure. ?Discussed epidural, and patient consents to the procedure:  included risk of  possible headache,backache, failed block, allergic reaction, and nerve injury. This patient was asked if she had any questions or concerns before the procedure started.)  ? ? ? ? ? ? ?Anesthesia Quick Evaluation ? ?

## 2022-02-09 NOTE — H&P (Signed)
Robin Nelson is a 26 y.o. female presenting @ [redacted] wk gestation in early labor. SROM clear fluid at 4 am. GBS cx neg. Uncomplicated pregnancy course. ?OB History   ? ? Gravida  ?2  ? Para  ?   ? Term  ?   ? Preterm  ?   ? AB  ?1  ? Living  ?   ?  ? ? SAB  ?1  ? IAB  ?   ? Ectopic  ?   ? Multiple  ?   ? Live Births  ?   ?   ?  ?  ? ?History reviewed. No pertinent past medical history. ?History reviewed. No pertinent surgical history. ?Family History: family history is not on file. ?Social History:  has no history on file for tobacco use, alcohol use, and drug use. ? ? ?  ?Maternal Diabetes: No ?Genetic Screening: Normal ?Maternal Ultrasounds/Referrals: Normal ?Fetal Ultrasounds or other Referrals:  None ?Maternal Substance Abuse:  No ?Significant Maternal Medications:  None ?Significant Maternal Lab Results:  Group B Strep negative ?Other Comments:  None ? ?Review of Systems  ?All other systems reviewed and are negative. ?History ?Dilation: 3 ?Effacement (%): 70 ?Station: -2 ?Exam by:: Robin Lesches, RN ?Blood pressure 127/81, pulse 64, temperature 97.9 ?F (36.6 ?C), temperature source Oral, resp. rate 18, height 5' 6.5" (1.689 m), weight 96.8 kg, SpO2 100 %. ?Exam ?Physical Exam ?Constitutional:   ?   Appearance: Normal appearance.  ?Eyes:  ?   Extraocular Movements: Extraocular movements intact.  ?Cardiovascular:  ?   Rate and Rhythm: Regular rhythm.  ?Pulmonary:  ?   Breath sounds: Normal breath sounds.  ?Abdominal:  ?   Comments: gravid  ?Musculoskeletal:  ?   Cervical back: Neck supple.  ?Skin: ?   General: Skin is warm.  ?Neurological:  ?   General: No focal deficit present.  ?   Mental Status: She is alert and oriented to person, place, and time.  ?Psychiatric:     ?   Mood and Affect: Mood normal.     ?   Behavior: Behavior normal.  ?  ?Prenatal labs: ?ABO, Rh: --/--/B POS (04/08 0701) ?Antibody: NEG (04/08 0701) ?Rubella:  Immune ?RPR:   NR ?HBsAg:   neg ?HIV:   neg ?GBS:   neg ? ?Assessment/Plan: ?SROM ?  Term ?Early labor ?P) admit routine labs. Epidural. Pitocin augmentation  ? ?Robin Nelson A Robin Nelson ?02/09/2022, 8:43 AM ? ? ? ? ?

## 2022-02-09 NOTE — MAU Note (Signed)
..  Devika Beliveau is a 26 y.o. at [redacted]w[redacted]d here in MAU reporting: contractions every 5 minutes and leaking of fluid with every contraction. Denies vaginal bleeding. +FM  ?GBSneg ? ?Pain score: 7/10 ?Vitals:  ? 02/09/22 0624  ?BP: 127/81  ?Pulse: 64  ?Resp: 18  ?Temp: 97.9 ?F (36.6 ?C)  ?SpO2: 100%  ?   ?[id number] applied at bedside 140s ? ?   ? ?

## 2022-02-09 NOTE — Anesthesia Postprocedure Evaluation (Signed)
Anesthesia Post Note ? ?Patient: Robin Nelson ? ?Procedure(s) Performed: AN AD HOC LABOR EPIDURAL ? ?  ? ?Patient location during evaluation: Mother Baby ?Anesthesia Type: Epidural ?Level of consciousness: awake and alert ?Pain management: pain level controlled ?Vital Signs Assessment: post-procedure vital signs reviewed and stable ?Respiratory status: spontaneous breathing, nonlabored ventilation and respiratory function stable ?Cardiovascular status: stable ?Postop Assessment: no headache, no backache and epidural receding ?Anesthetic complications: no ? ? ?No notable events documented. ? ?Last Vitals:  ?Vitals:  ? 02/09/22 1746 02/09/22 1810  ?BP: 112/69 111/69  ?Pulse: 66 73  ?Resp:  18  ?Temp:  37.1 ?C  ?SpO2:  99%  ?  ?Last Pain:  ?Vitals:  ? 02/09/22 1810  ?TempSrc: Oral  ?PainSc: 3   ? ?Pain Goal: Patients Stated Pain Goal: 3 (02/09/22 1810) ? ?  ?  ?  ?  ?  ?  ?Epidural/Spinal Function Cutaneous sensation: Able to Wiggle Toes (02/09/22 1810), Patient able to flex knees: Yes (02/09/22 1810), Patient able to lift hips off bed: Yes (02/09/22 1810), Back pain beyond tenderness at insertion site: No (02/09/22 1810), Progressively worsening motor and/or sensory loss: No (02/09/22 1810), Bowel and/or bladder incontinence post epidural: No (02/09/22 1810) ? ?Elliot Meldrum ? ? ? ? ?

## 2022-02-09 NOTE — Lactation Note (Signed)
This note was copied from a baby's chart. ?Lactation Consultation Note ? ?Patient Name: Boy [facility] ?Today's Date: 02/09/2022 ?Reason for consult: Initial assessment;1st time breastfeeding;Primapara;Term ?Age:28 hours ? ?LC in to assist with baby's latch.  Mom with large, heavy breasts and compressible areola and erect nipples.  Baby latched on right breast in football hold as baby was vigorously cueing.   ? ?Ped entered room and LC assisted to take baby off the breast for Ped exam. ? ?Baby assisted to re-latch again.  Baby opens his mouth and latches easily.  Pulled on chin to flange lower lip.  Mom feels a little discomfort  initially but it quickly subsided. ? ?Encouraged Mom to keep baby STS and offer him the breast with cues.  Mom to ask for help prn ? ?Maternal Data ?Has patient been taught Hand Expression?: Yes ?Does the patient have breastfeeding experience prior to this delivery?: No ? ?Feeding ?Mother's Current Feeding Choice: Breast Milk ? ?LATCH Score ?Latch: Grasps breast easily, tongue down, lips flanged, rhythmical sucking. ? ?Audible Swallowing: Spontaneous and intermittent ? ?Type of Nipple: Everted at rest and after stimulation ? ?Comfort (Breast/Nipple): Soft / non-tender ? ?Hold (Positioning): Assistance needed to correctly position infant at breast and maintain latch. ? ?LATCH Score: 9 ? ?Interventions ?Interventions: Breast feeding basics reviewed;Assisted with latch;Skin to skin;Breast massage;Hand express;Adjust position;Support pillows;Position options ? ? ?Consult Status ?Consult Status: Follow-up ?Date: 02/10/22 ?Follow-up type: In-patient ? ? ? ?Johny Blamer E ?02/09/2022, 7:10 PM ? ? ? ?

## 2022-02-09 NOTE — Lactation Note (Signed)
This note was copied from a baby's chart. ?Lactation Consultation Note ?Mom had baby in cross cradle positioned assisted by RN. ?Good body alignment and latch noted. ?Good support. ? ?Patient Name: Boy [facility] ?Today's Date: 02/09/2022 ?Reason for consult: Mother's request ?Age:28 hours ? ?Maternal Data ?  ? ?Feeding ?Mother's Current Feeding Choice: Breast Milk ? ?LATCH Score ?Latch: Grasps breast easily, tongue down, lips flanged, rhythmical sucking. ? ?Audible Swallowing: A few with stimulation ? ?Type of Nipple: Everted at rest and after stimulation ? ?Comfort (Breast/Nipple): Soft / non-tender ? ?Hold (Positioning): Assistance needed to correctly position infant at breast and maintain latch. (RN latched mom) ? ?LATCH Score: 8 ? ? ?Lactation Tools Discussed/Used ?  ? ?Interventions ?Interventions: Breast feeding basics reviewed;Assisted with latch;Skin to skin;Hand express;Breast compression;Adjust position;Support pillows;Position options;Education ? ?Discharge ?  ? ?Consult Status ?Consult Status: Follow-up ?Date: 02/10/22 ?Follow-up type: In-patient ? ? ? ?Charyl Dancer ?02/09/2022, 10:20 PM ? ? ? ?

## 2022-02-09 NOTE — Lactation Note (Signed)
This note was copied from a baby's chart. ?Lactation Consultation Note ? ?Patient Name: Robin Nelson ?Today's Date: 02/09/2022 ?Reason for consult: L&D Initial assessment;1st time breastfeeding;Primapara;Term ?Age:28 hours ? ?LC in to assist with first feeding in L&D.  Baby STS on Mom's chest and vigorously cueing.   ? ?Assisted with placing baby in football hold on right breast.  Baby rooted and opened his mouth and latched.   ? ?Encouraged Mom to keep baby STS and watch for feeding cues.  Mom can ask for help prn. ? ?Maternal Data ?Has patient been taught Hand Expression?: Yes ?Does the patient have breastfeeding experience prior to this delivery?: No ? ?Feeding ?Mother's Current Feeding Choice: Breast Milk ? ?LATCH Score ?Latch: Repeated attempts needed to sustain latch, nipple held in mouth throughout feeding, stimulation needed to elicit sucking reflex. ? ?Audible Swallowing: A few with stimulation ? ?Type of Nipple: Everted at rest and after stimulation ? ?Comfort (Breast/Nipple): Soft / non-tender ? ?Hold (Positioning): Assistance needed to correctly position infant at breast and maintain latch. ? ?LATCH Score: 7 ? ?Interventions ?Interventions: Breast feeding basics reviewed;Assisted with latch;Skin to skin;Breast massage;Hand express;Adjust position;Support pillows;Position options ? ?Consult Status ?Consult Status: Follow-up from L&D ?Date: 02/09/22 ?Follow-up type: In-patient ? ? ? ?Robin Nelson ?02/09/2022, 5:10 PM ? ? ? ?

## 2022-02-09 NOTE — MAU Note (Signed)
Pt sitting up in bed, leaning over when entered rm. Breathing with a ctx.  O2 sat applied to finger, picked up maternal pulse where tracing. Pt sat back up, fetal tracing resumed.  Explained to pt. ?

## 2022-02-09 NOTE — Progress Notes (Signed)
S: no complaint ? ?O: BP 119/70   Pulse (!) 58   Temp 97.9 ?F (36.6 ?C) (Oral)   Resp 18   Ht 5' 6.5" (1.689 m)   Wt 96.8 kg   SpO2 99%   BMI 33.91 kg/m?  ?Epidural ?Pitocin 6 miu ?VE per RN 5/90/-1 ? ?Tracing; baseline 145 minimum variability ?Ctx q 3-4 mins ? ?IMP: active phase ?SROM ?Term gestation ?P) cont with pitocin ?

## 2022-02-09 NOTE — Anesthesia Procedure Notes (Signed)
Epidural ?Patient location during procedure: OB ?Start time: 02/09/2022 9:07 AM ?End time: 02/09/2022 9:10 AM ? ?Staffing ?Anesthesiologist: Janeece Riggers, MD ? ?Preanesthetic Checklist ?Completed: patient identified, IV checked, site marked, risks and benefits discussed, surgical consent, monitors and equipment checked, pre-op evaluation and timeout performed ? ?Epidural ?Patient position: sitting ?Prep: DuraPrep and site prepped and draped ?Patient monitoring: continuous pulse ox and blood pressure ?Approach: midline ?Location: L4-L5 ?Injection technique: LOR air ? ?Needle:  ?Needle type: Tuohy  ?Needle gauge: 17 G ?Needle length: 9 cm and 9 ?Needle insertion depth: 6 cm ?Catheter type: closed end flexible ?Catheter size: 19 Gauge ?Catheter at skin depth: 12 cm ?Test dose: negative ? ?Assessment ?Events: blood not aspirated, injection not painful, no injection resistance, no paresthesia and negative IV test ? ? ? ?

## 2022-02-10 LAB — CBC
HCT: 26.6 % — ABNORMAL LOW (ref 36.0–46.0)
Hemoglobin: 8.8 g/dL — ABNORMAL LOW (ref 12.0–15.0)
MCH: 29.2 pg (ref 26.0–34.0)
MCHC: 33.1 g/dL (ref 30.0–36.0)
MCV: 88.4 fL (ref 80.0–100.0)
Platelets: 189 10*3/uL (ref 150–400)
RBC: 3.01 MIL/uL — ABNORMAL LOW (ref 3.87–5.11)
RDW: 13.4 % (ref 11.5–15.5)
WBC: 9.7 10*3/uL (ref 4.0–10.5)
nRBC: 0 % (ref 0.0–0.2)

## 2022-02-10 MED ORDER — SODIUM CHLORIDE 0.9 % IV SOLN
500.0000 mg | Freq: Once | INTRAVENOUS | Status: AC
  Administered 2022-02-10: 500 mg via INTRAVENOUS
  Filled 2022-02-10: qty 25

## 2022-02-10 NOTE — Progress Notes (Signed)
PPD1 SVD:  ? ?S:  Pt reports feeling well/ Tolerating po/ Voiding without problems/ No n/v/ Bleeding is light/ Pain controlled withprescription NSAID's including ibuprofen (Motrin) ? Newborn info live female BRF ? ? ?O:  A & O x 3 / VS: Blood pressure 117/74, pulse 68, temperature 97.8 ?F (36.6 ?C), temperature source Oral, resp. rate 18, height 5' 6.5" (1.689 m), weight 96.8 kg, SpO2 100 %, unknown if currently breastfeeding. ? LABS:  ?Results for orders placed or performed during the hospital encounter of 02/09/22 (from the past 24 hour(s))  ?CBC     Status: Abnormal  ? Collection Time: 02/09/22  7:04 PM  ?Result Value Ref Range  ? WBC 10.6 (H) 4.0 - 10.5 K/uL  ? RBC 3.34 (L) 3.87 - 5.11 MIL/uL  ? Hemoglobin 9.8 (L) 12.0 - 15.0 g/dL  ? HCT 28.9 (L) 36.0 - 46.0 %  ? MCV 86.5 80.0 - 100.0 fL  ? MCH 29.3 26.0 - 34.0 pg  ? MCHC 33.9 30.0 - 36.0 g/dL  ? RDW 13.2 11.5 - 15.5 %  ? Platelets 198 150 - 400 K/uL  ? nRBC 0.0 0.0 - 0.2 %  ?Comprehensive metabolic panel     Status: Abnormal  ? Collection Time: 02/09/22  7:04 PM  ?Result Value Ref Range  ? Sodium 136 135 - 145 mmol/L  ? Potassium 3.8 3.5 - 5.1 mmol/L  ? Chloride 106 98 - 111 mmol/L  ? CO2 21 (L) 22 - 32 mmol/L  ? Glucose, Bld 98 70 - 99 mg/dL  ? BUN 6 6 - 20 mg/dL  ? Creatinine, Ser 0.66 0.44 - 1.00 mg/dL  ? Calcium 8.9 8.9 - 10.3 mg/dL  ? Total Protein 5.8 (L) 6.5 - 8.1 g/dL  ? Albumin 2.7 (L) 3.5 - 5.0 g/dL  ? AST 23 15 - 41 U/L  ? ALT 9 0 - 44 U/L  ? Alkaline Phosphatase 98 38 - 126 U/L  ? Total Bilirubin 0.8 0.3 - 1.2 mg/dL  ? GFR, Estimated >60 >60 mL/min  ? Anion gap 9 5 - 15  ?Uric acid     Status: None  ? Collection Time: 02/09/22  7:04 PM  ?Result Value Ref Range  ? Uric Acid, Serum 4.5 2.5 - 7.1 mg/dL  ?CBC     Status: Abnormal  ? Collection Time: 02/10/22  4:54 AM  ?Result Value Ref Range  ? WBC 9.7 4.0 - 10.5 K/uL  ? RBC 3.01 (L) 3.87 - 5.11 MIL/uL  ? Hemoglobin 8.8 (L) 12.0 - 15.0 g/dL  ? HCT 26.6 (L) 36.0 - 46.0 %  ? MCV 88.4 80.0 - 100.0 fL  ? MCH  29.2 26.0 - 34.0 pg  ? MCHC 33.1 30.0 - 36.0 g/dL  ? RDW 13.4 11.5 - 15.5 %  ? Platelets 189 150 - 400 K/uL  ? nRBC 0.0 0.0 - 0.2 %  ? ? I&O: I/O last 3 completed shifts: ?In: -  ?Out: 575 [Urine:400; Blood:175] ?  No intake/output data recorded. ? Lungs: chest clear, no wheezing, rales, normal symmetric air entry ? Heart: regular rate and rhythm, S1, S2 normal, no murmur, click, rub or gallop ? Abdomen: soft uterus firm non tender ? Perineum: healing with good reapproximation ? Lochia: light ? Extremities:no redness or tenderness in the calves or thighs, no edema ?  ? ?A/P: PPD # 1/ [id number] ?IDA ? Doing well ? Continue routine post partum orders ?IV iron infusion ? Anticipate home in am  ?

## 2022-02-10 NOTE — Lactation Note (Addendum)
This note was copied from a baby's chart. ?Lactation Consultation Note ? ?Patient Name: [name] ?Today's Date: 02/10/2022 ?Reason for consult: Mother's request;Follow-up assessment ?Age:26 years ? ?Follow-up visit to 23-hours infant, per mother's request. Mother requests assistance with latch. Set up support pillows for football position but unable to latch. Transition to cross-cradle to right breast. Demonstrated alignment and asymmetrical latch. Colostrum is easily expressed. Latched infant after a few attempts. Noted suckling and swallowing. Mother denies discomfort or pain.  ? ?Educated mom about deep latch for an optimal breastfeeding session. ? ?Plan:   ?1-Breastfeeding on demand, ensuring a deep, comfortable latch.  ?2-Keep infant awake during breastfeeding session: massaging breast, infant's hand/shoulder/feet ?3-Maternal self-care encouraged ? ?Contact LC as needed for feeds/support/concerns/questions ? ? ?Maternal Data ?Has patient been taught Hand Expression?: Yes ? ?Feeding ?Mother's Current Feeding Choice: Breast Milk and Formula ? ?LATCH Score ?Latch: Grasps breast easily, tongue down, lips flanged, rhythmical sucking. ? ?Audible Swallowing: Spontaneous and intermittent ? ?Type of Nipple: Everted at rest and after stimulation ? ?Comfort (Breast/Nipple): Soft / non-tender ? ?Hold (Positioning): Assistance needed to correctly position infant at breast and maintain latch. ? ?LATCH Score: 9 ? ? ?Lactation Tools Discussed/Used ?Tools: Pump;Flanges ?Flange Size: 27 ?Breast pump type: Manual ?Pump Education: Setup, frequency, and cleaning ?Reason for Pumping: stimulation ?Pumping frequency: as needed ? ?Interventions ?Interventions: Breast feeding basics reviewed;Assisted with latch;Skin to skin;Breast massage;Hand express;Pre-pump if needed;Breast compression;Adjust position;Support pillows;Position options;Expressed milk;Hand pump;Education ? ?Discharge ?Pump: Manual;Personal ? ?Consult  Status ?Consult Status: Follow-up ?Date: 02/11/22 ?Follow-up type: In-patient ? ? ? ?[name] ?02/10/2022, 3:30 PM ? ? ? ?

## 2022-02-10 NOTE — Lactation Note (Signed)
This note was copied from a baby's chart. ?Lactation Consultation Note ?Mom has called out frequently to RN for latch assistance. ?LC went to see mom. LC checked diaper, dry, burped baby and then placed baby in football position. Mom stated he is popping off and on and it's hard to get him re-latched. ?LC discussed support, props, support during BF. ?Positioned mom's hands and got mom to latch baby. Baby BF well. Mom just unsure of herself and explained baby is learning as well. ?How we know if baby is getting enough is by output and satisfaction as well as % of weight loss. ?Baby is only 39 hrs old. Encouraged mom to rest when she can, if needed FOB will hold baby while mom takes a nap after mom finishes BF. ?Baby feeding well. ? ?Patient Name: Boy [facility] ?Today's Date: 02/10/2022 ?Reason for consult: Mother's request;Primapara;Term ?Age:28 hours ? ?Maternal Data ?Does the patient have breastfeeding experience prior to this delivery?: No ? ?Feeding ?  ? ?LATCH Score ?Latch: Grasps breast easily, tongue down, lips flanged, rhythmical sucking. ? ?Audible Swallowing: A few with stimulation ? ?Type of Nipple: Everted at rest and after stimulation ? ?Comfort (Breast/Nipple): Soft / non-tender ? ?Hold (Positioning): Assistance needed to correctly position infant at breast and maintain latch. ? ?LATCH Score: 8 ? ? ?Lactation Tools Discussed/Used ?  ? ?Interventions ?Interventions: Breast feeding basics reviewed;Assisted with latch;Skin to skin;Breast massage;Hand express;Breast compression;Adjust position;Support pillows;Position options ? ?Discharge ?  ? ?Consult Status ?Consult Status: Follow-up ?Date: 02/11/22 ?Follow-up type: In-patient ? ? ? ?Charyl Dancer ?02/10/2022, 3:18 AM ? ? ? ?

## 2022-02-11 MED ORDER — IBUPROFEN 600 MG PO TABS: 600.0000 mg | ORAL_TABLET | Freq: Four times a day (QID) | ORAL | 11 refills | Status: DC | PRN

## 2022-02-11 MED ORDER — FERROUS SULFATE 325 (65 FE) MG PO TABS
325.0000 mg | ORAL_TABLET | Freq: Two times a day (BID) | ORAL | 6 refills | Status: AC
Start: 2022-02-11 — End: ?

## 2022-02-11 NOTE — Discharge Instructions (Signed)
Call if temperature greater than equal to 100.4, nothing per vagina for 4-6 weeks or severe nausea vomiting, increased incisional pain , drainage or redness in the incision site, no straining with bowel movements, showers no bath °

## 2022-02-11 NOTE — Progress Notes (Signed)
PPD2 SVD:  ? ?S:  Pt reports feeling well/ Tolerating po/ Voiding without problems/ No n/v/ Bleeding is light/ Pain controlled withprescription NSAID's including ibuprofen (Motrin) ? Newborn info live female circ done ? ? ?O:  A & O x 3 / VS: Blood pressure 114/69, pulse 70, temperature 97.8 ?F (36.6 ?C), temperature source Oral, resp. rate 19, height 5' 6.5" (1.689 m), weight 96.8 kg, SpO2 98 %, unknown if currently breastfeeding. ? LABS: No results found for this or any previous visit (from the past 24 hour(s)). ? I&O: No intake/output data recorded. ?  No intake/output data recorded. ? Lungs: chest clear, no wheezing, rales, normal symmetric air entry ? Heart: regular rate and rhythm, S1, S2 normal, no murmur, click, rub or gallop ? Abdomen: soft uterus firm at umb ? Perineum: healing with good reapproximation ? Lochia: light ? Extremities:no redness or tenderness in the calves or thighs, no edema ?  ? ?A/P: PPD # 2/ [id number] ?IDA s/p IV iron infusion ? Doing well ? Continue routine post partum orders ? D/c home ?D/c instructions reviewed ?F/u 6 wk  ?

## 2022-02-11 NOTE — Lactation Note (Signed)
This note was copied from a baby's chart. ?Lactation Consultation Note ? ?Patient Name: Boy [facility] ?Today's Date: 02/11/2022 ?Reason for consult: Follow-up assessment;Infant weight loss;Other (Comment) (per mom baby having a circ done. Dyad for D/C today. per mom  breast feeding is going well and milk feels like its coming in. Mom mentioned she has felt nodules in her breast and massage is helping. LC reviewed BF D/C teaching. Mom aware of LC resources.) ?Age:28 hours ? ?Maternal Data ?  ? ?Feeding ?Mother's Current Feeding Choice: Breast Milk and Formula ? ?LATCH Score ?  ? ?  ? ?  ? ?  ? ?  ? ?  ? ? ?Lactation Tools Discussed/Used ?  ? ?Interventions ?Interventions: Breast feeding basics reviewed;Hand pump;LC Services brochure ? ?Discharge ?Discharge Education: Engorgement and breast care;Warning signs for feeding baby ?Pump: Manual;Personal ? ?Consult Status ?Consult Status: Complete ?Date: 02/11/22 ? ? ? ?Matilde Sprang Tallan Sandoz ?02/11/2022, 1:51 PM ? ? ? ?

## 2022-02-11 NOTE — Discharge Summary (Signed)
? ?  Postpartum Discharge Summary ? ?Date of Service updated ? ?   ?Patient Name: Robin Nelson ?DOB: 05-10-1996 ?MRN: [id number] ? ?Date of admission: 02/09/2022 ?Delivery date:02/09/2022  ?Delivering provider: Ruthmary Occhipinti, Alanda Slim  ?Date of discharge: 02/11/2022 ? ?Admitting diagnosis: Indication for care in labor or delivery [O75.9] ?Normal labor and delivery [O80] ?Postpartum care following vaginal delivery [Z39.2] ?Intrauterine pregnancy: [redacted]w[redacted]d    ?Secondary diagnosis:  Principal Problem: ?  Indication for care in labor or delivery ?Active Problems: ?  Normal labor and delivery ?  Postpartum care following vaginal delivery ? ?Additional problems: n/a    ?Discharge diagnosis: Term Pregnancy Delivered and Anemia                                              ?Post partum procedures: IV iron infusion ?Augmentation: Pitocin ?Complications: None ? ?Hospital course: Onset of Labor With Vaginal Delivery      ?26y.o. yo G2P1011 at 359w0das admitted in Active Labor on 02/09/2022. Patient had an uncomplicated labor course as follows:  ?Membrane Rupture Time/Date: 3:00 AM ,02/09/2022   ?Delivery Method:Vaginal, Spontaneous  ?Episiotomy: None  ?Lacerations:  2nd degree;Perineal  ?Patient had an uncomplicated postpartum course.  She is ambulating, tolerating a regular diet, passing flatus, and urinating well. Patient is discharged home in stable condition on 02/11/2022 ? ?Newborn Data: ?Birth date:02/09/2022  ?Birth time:4:11 PM  ?Gender:Female  ?Living status:Living  ?Apgars:9 ,9  ?Weight:3.43 kg  ? ?Magnesium Sulfate received: No ?BMZ received: No ?Rhophylac:No ?MMR:No ?T-DaP:Given prenatally ?Flu: No ?Transfusion:No ? ?Physical exam  ?Vitals:  ? 02/10/22 0928 02/10/22 1700 02/10/22 2332 02/11/22 0631  ?BP: 117/74 119/63 115/63 114/69  ?Pulse: 68 85 66 70  ?Resp:   19   ?Temp:   98.3 ?F (36.8 ?C) 97.8 ?F (36.6 ?C)  ?TempSrc:   Oral Oral  ?SpO2: 100%  97% 98%  ?Weight:      ?Height:      ? ?General: alert, cooperative, and no  distress ?Lochia: appropriate ?Uterine Fundus: firm ?Incision: N/A ?DVT Evaluation: No evidence of DVT seen on physical exam. ?Negative Homan's sign. ?Labs: ?Lab Results  ?Component Value Date  ? WBC 9.7 02/10/2022  ? HGB 8.8 (L) 02/10/2022  ? HCT 26.6 (L) 02/10/2022  ? MCV 88.4 02/10/2022  ? PLT 189 02/10/2022  ? ? ?  Latest Ref Rng & Units 02/09/2022  ?  7:04 PM  ?CMP  ?Glucose 70 - 99 mg/dL 98    ?BUN 6 - 20 mg/dL 6    ?Creatinine 0.44 - 1.00 mg/dL 0.66    ?Sodium 135 - 145 mmol/L 136    ?Potassium 3.5 - 5.1 mmol/L 3.8    ?Chloride 98 - 111 mmol/L 106    ?CO2 22 - 32 mmol/L 21    ?Calcium 8.9 - 10.3 mg/dL 8.9    ?Total Protein 6.5 - 8.1 g/dL 5.8    ?Total Bilirubin 0.3 - 1.2 mg/dL 0.8    ?Alkaline Phos 38 - 126 U/L 98    ?AST 15 - 41 U/L 23    ?ALT 0 - 44 U/L 9    ? ?Edinburgh Score: ? ?  02/10/2022  ? 12:18 AM  ?EdFlavia Shipperostnatal Depression Scale Screening Tool  ?I have been able to laugh and see the funny side of things. 0  ?I have looked forward with enjoyment to things.  0  ?I have blamed myself unnecessarily when things went wrong. 1  ?I have been anxious or worried for no good reason. 1  ?I have felt scared or panicky for no good reason. 0  ?Things have been getting on top of me. 0  ?I have been so unhappy that I have had difficulty sleeping. 0  ?I have felt sad or miserable. 0  ?I have been so unhappy that I have been crying. 0  ?The thought of harming myself has occurred to me. 0  ?Edinburgh Postnatal Depression Scale Total 2  ? ? ? ? ?After visit meds:  ?Allergies as of 02/11/2022   ?No Known Allergies ?  ? ?  ?Medication List  ?  ? ?TAKE these medications   ? ?ferrous sulfate 325 (65 FE) MG tablet ?Take 1 tablet (325 mg total) by mouth 2 (two) times daily with a meal. ?  ?ibuprofen 600 MG tablet ?Commonly known as: ADVIL ?Take 1 tablet (600 mg total) by mouth every 6 (six) hours as needed. ?  ? ?  ? ? ? ?Discharge home in stable condition ?Infant Feeding: Breast ?Infant Disposition:home with  mother ?Discharge instruction: per After Visit Summary and Postpartum booklet. ?Activity: Advance as tolerated. Pelvic rest for 6 weeks.  ?Diet: routine diet ?Anticipated Birth Control: Condoms ?Postpartum Appointment:6 weeks ?Additional Postpartum F/U:  n/a ?Future Appointments:No future appointments. ?Follow up Visit: ? Follow-up Information   ? ? Servando Salina, MD Follow up in 6 week(s).   ?Specialty: Obstetrics and Gynecology ?Contact information: ?[address] ?[place] [address] ?[phone] ? ? ?  ?  ? ?  ?  ? ?  ? ? ? ?  ? ?02/11/2022 ?Marvene Staff, MD ? ? ?

## 2022-02-21 ENCOUNTER — Telehealth (HOSPITAL_COMMUNITY): Payer: Self-pay | Admitting: *Deleted

## 2022-02-21 NOTE — Telephone Encounter (Signed)
Mom reports feeling good. No concerns about herself at this time. EPDS=0 [facility] score=2) ?Mom reports baby is doing well. Feeding, peeing, and pooping without difficulty. Safe sleep reviewed. Mom reports no concerns about baby at present. ? ?Odis Hollingshead, RN 02-21-2022 at 2:25pm ?

## 2022-08-08 IMAGING — US US OB TRANSVAGINAL
1 series · 15 of 28 positions shown · non-contrast
Comparison: None.

CLINICAL DATA: Pregnant, vaginal bleeding, LMP 05/12/2021

EXAM:
OBSTETRIC <14 WK US AND TRANSVAGINAL OB US
TECHNIQUE: Both transabdominal and transvaginal ultrasound examinations were
performed for complete evaluation of the gestation as well as the
maternal uterus, adnexal regions, and pelvic cul-de-sac.
Transvaginal technique was performed to assess early pregnancy.

[Series 1: us ob comp less 14 wks mc & wl · 49 acquisitions, 15 frames shown]
[im 1/49]
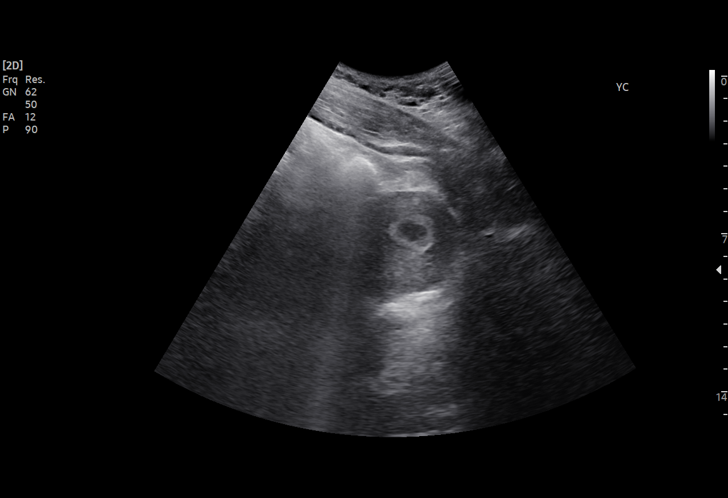
[im 4/49]
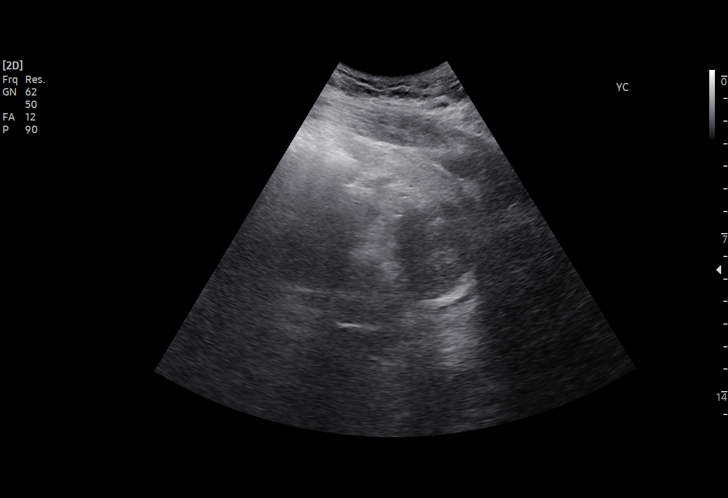
[im 8/49]
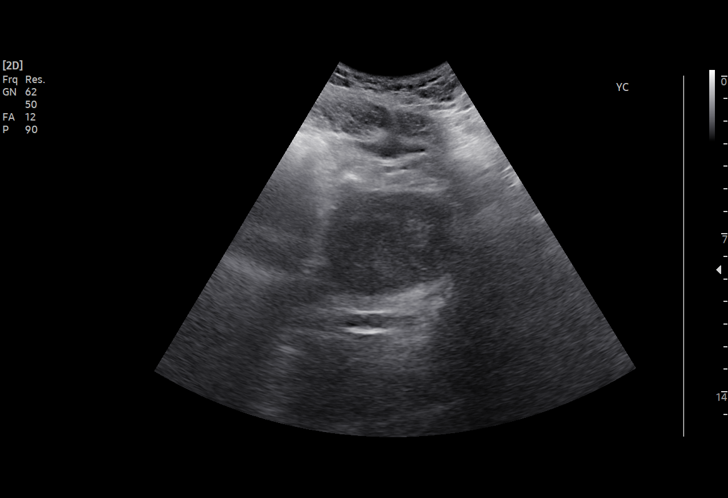
[im 11/49]
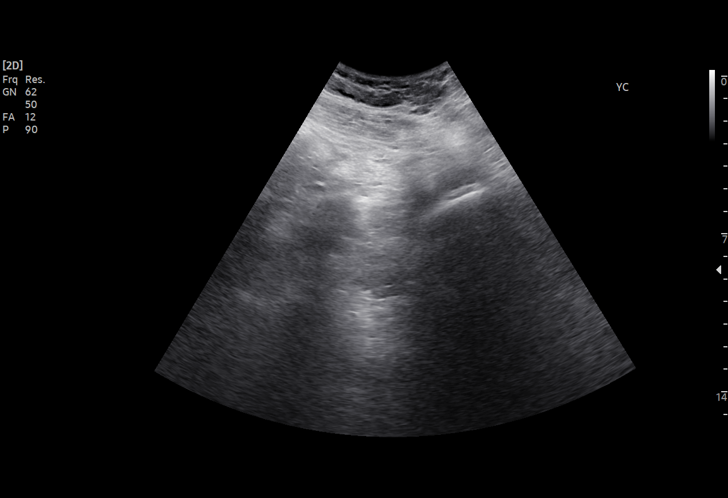
[im 15/49]
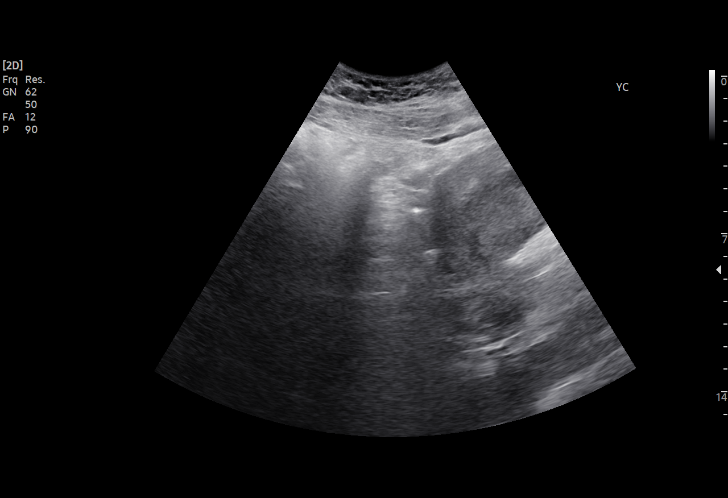
[im 18/49]
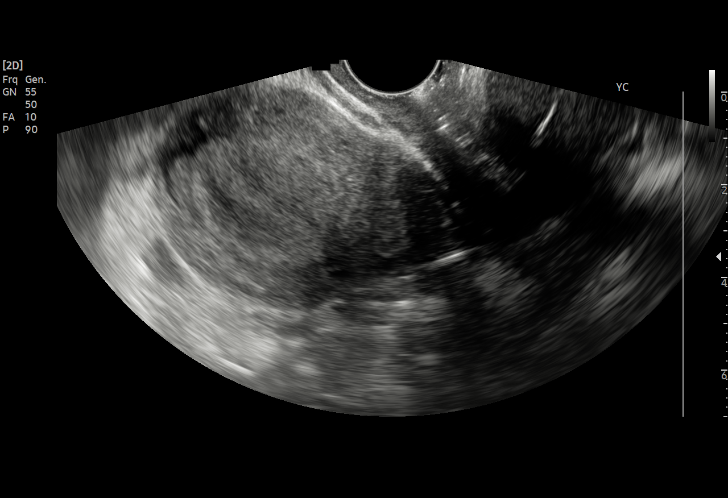
[im 22/49]
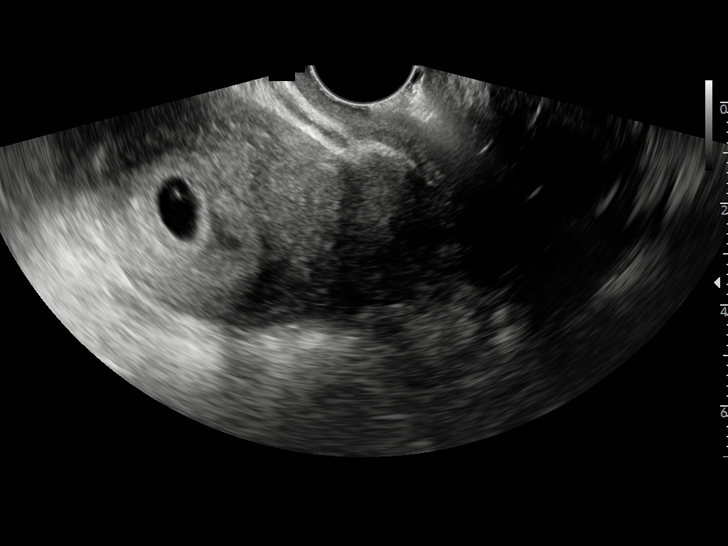
[im 25/49]
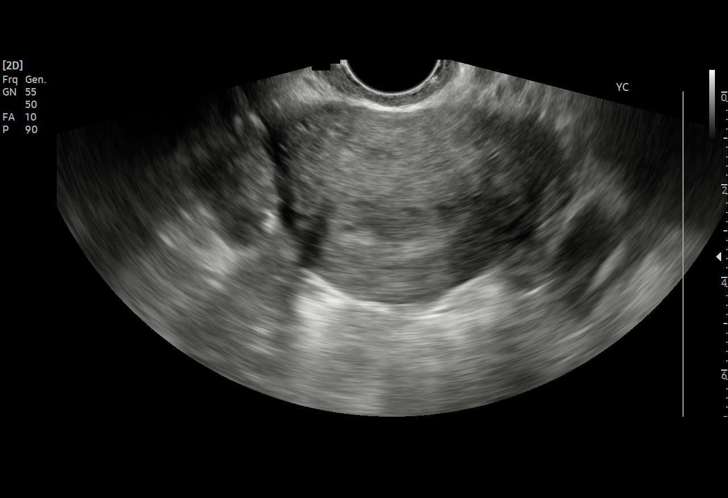
[im 27/49]
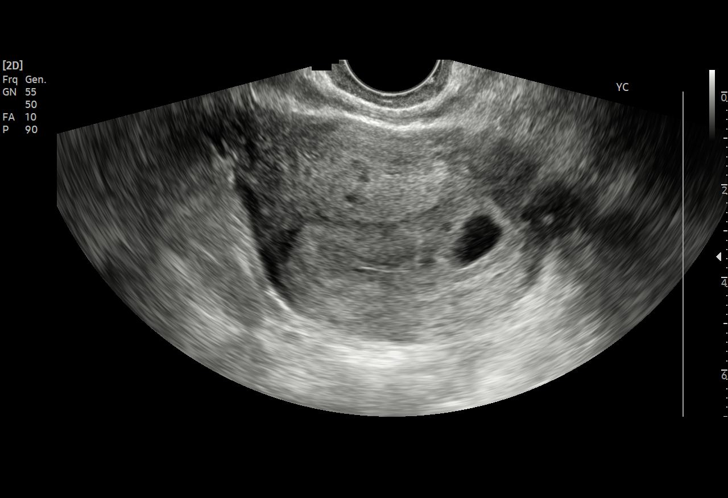
[im 31/49]
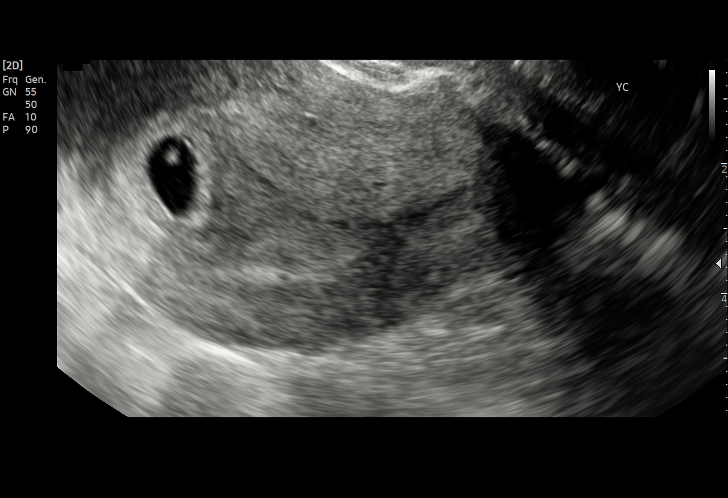
[im 34/49]
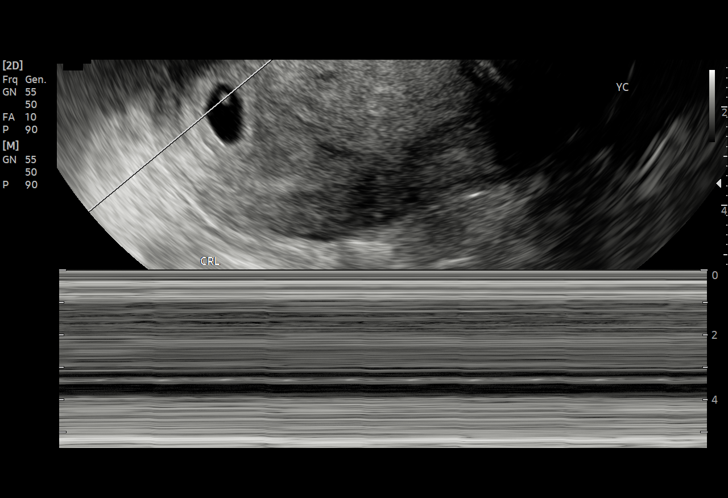
[im 38/49]
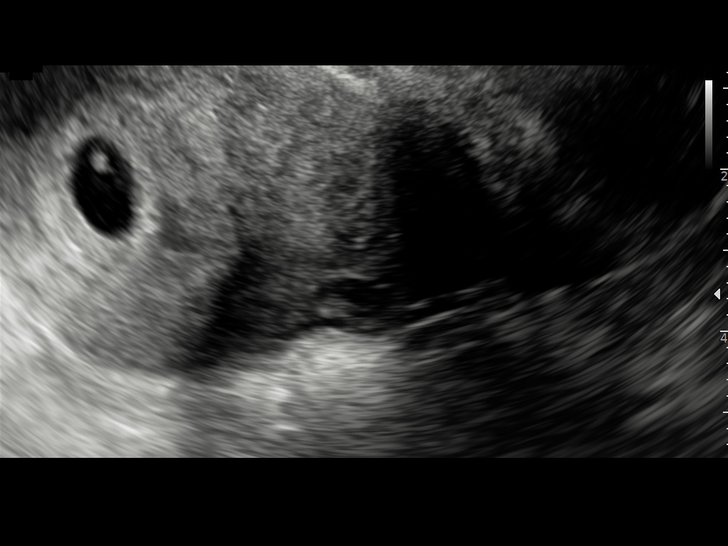
[im 41/49]
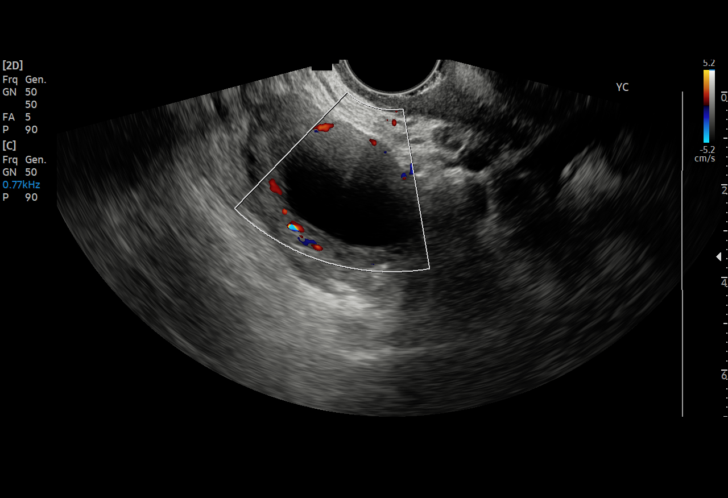
[im 45/49]
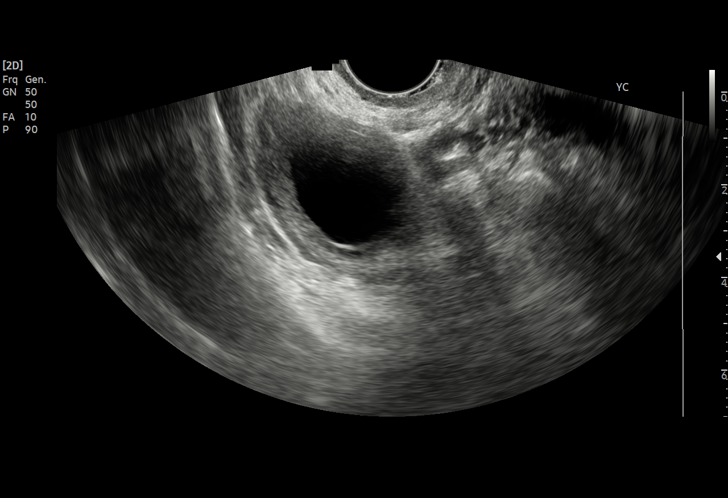
[im 49/49]
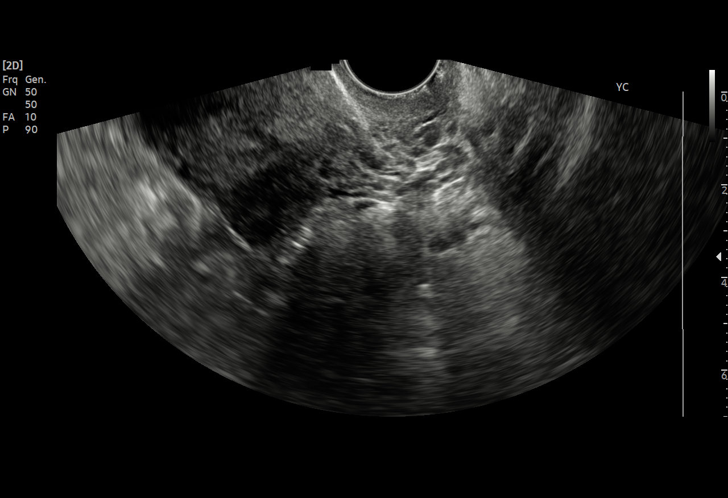

[15 of 28 positions shown; findings below may reference images not displayed]

FINDINGS: Intrauterine gestational sac: Present, single. The gestational sac
is seen within the lateral, superior aspect of the endometrial
cavity compatible with an angular pregnancy.

Yolk sac:  Present, single, normal appearing

Embryo:  Present, single

Cardiac Activity: Present, regular

Heart Rate: 95 bpm

MSD: Appropriate given fetal size

CRL:  4 mm   6 w   1 d                  US EDC: 02/18/2022

Subchorionic hemorrhage:  None visualized.

Maternal uterus/adnexae: The uterus is anteverted. No intrauterine
masses are seen. The cervix is not specifically evaluated on on this
exam. The left ovary is not visualized. A corpus luteum is
identified within the right ovary. No free fluid is seen within the
cul-de-sac.
IMPRESSION: Single living intrauterine gestation with an estimated gestational
age of 6 weeks, 1 day.

Gestational sac position within the superolateral aspect of the
endometrial cavity, which appears normal in configuration,
compatible with an angular pregnancy.

## 2022-08-08 IMAGING — US US OB COMP LESS 14 WK
1 series · 15 of 28 positions shown · non-contrast
Comparison: None.

CLINICAL DATA: Pregnant, vaginal bleeding, LMP 05/12/2021

EXAM:
OBSTETRIC <14 WK US AND TRANSVAGINAL OB US
TECHNIQUE: Both transabdominal and transvaginal ultrasound examinations were
performed for complete evaluation of the gestation as well as the
maternal uterus, adnexal regions, and pelvic cul-de-sac.
Transvaginal technique was performed to assess early pregnancy.

[Series 1: us ob comp less 14 wks mc & wl · 49 acquisitions, 15 frames shown]
[im 1/49]
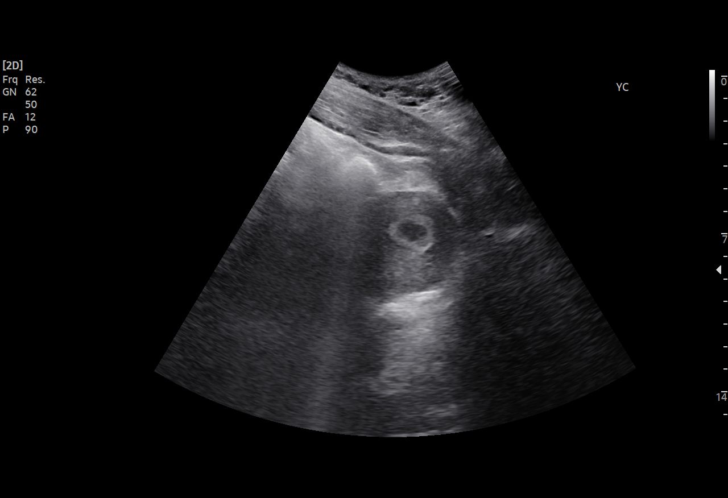
[im 4/49]
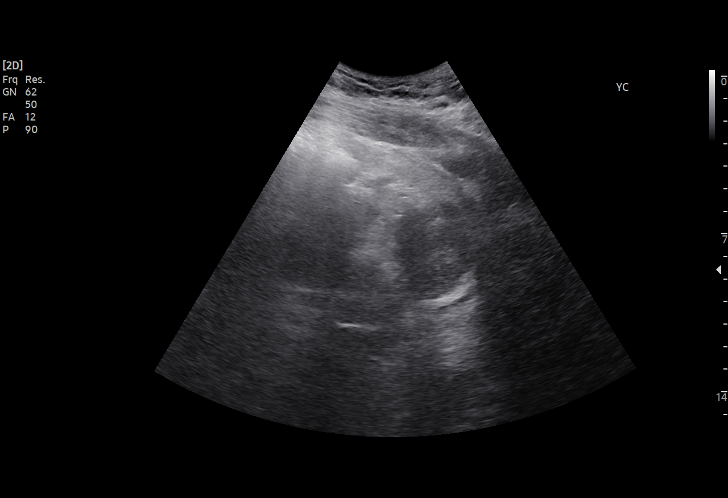
[im 8/49]
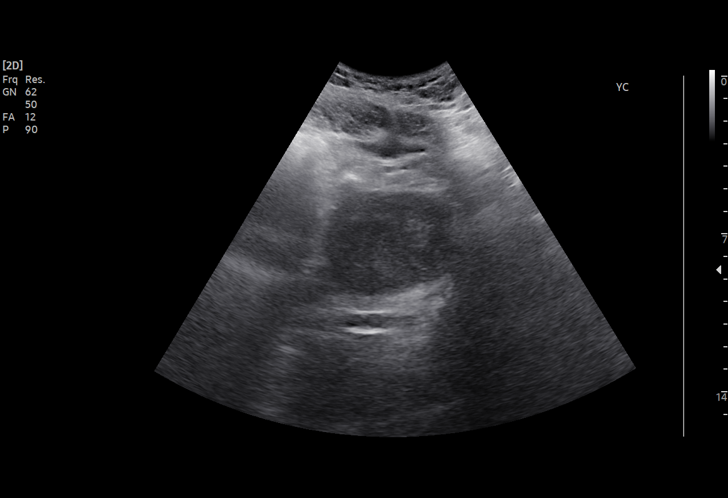
[im 11/49]
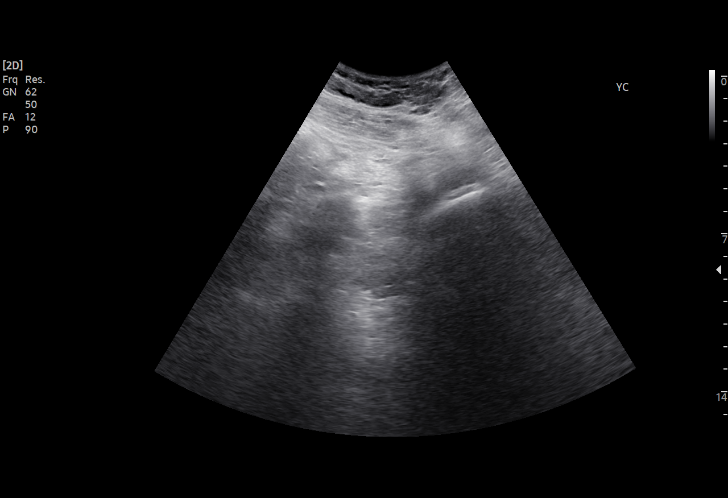
[im 15/49]
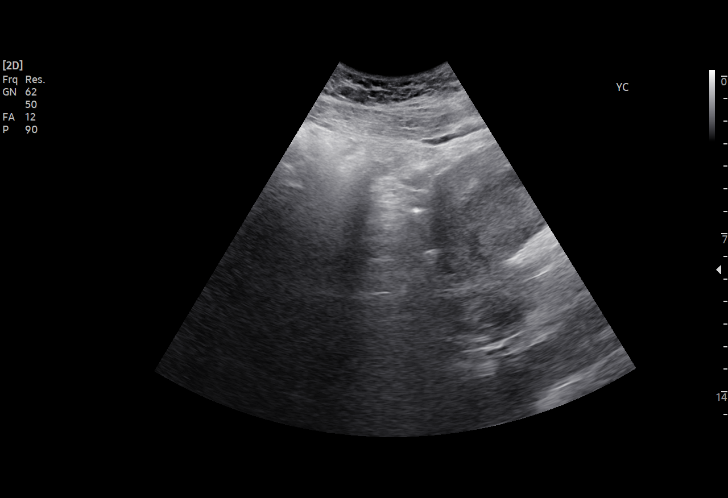
[im 18/49]
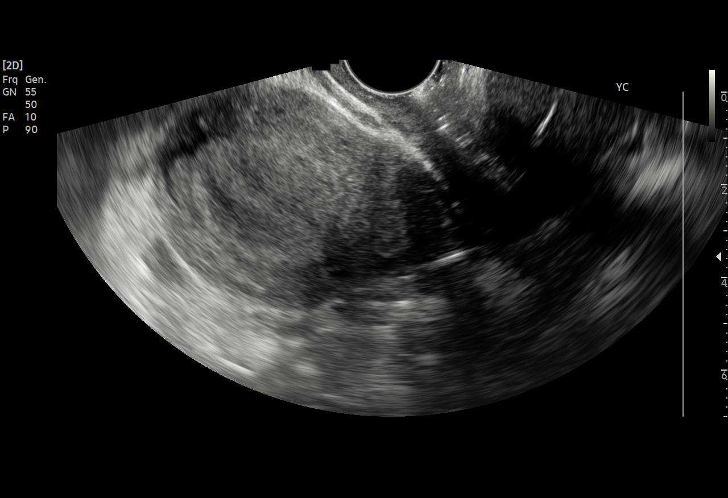
[im 22/49]
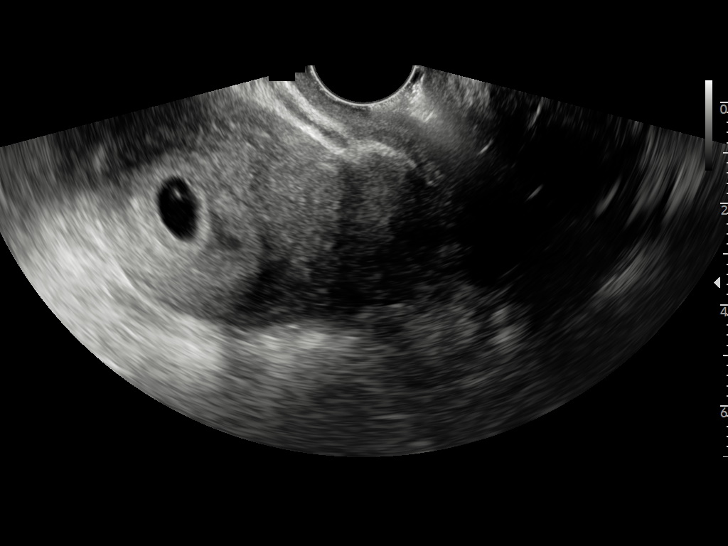
[im 25/49]
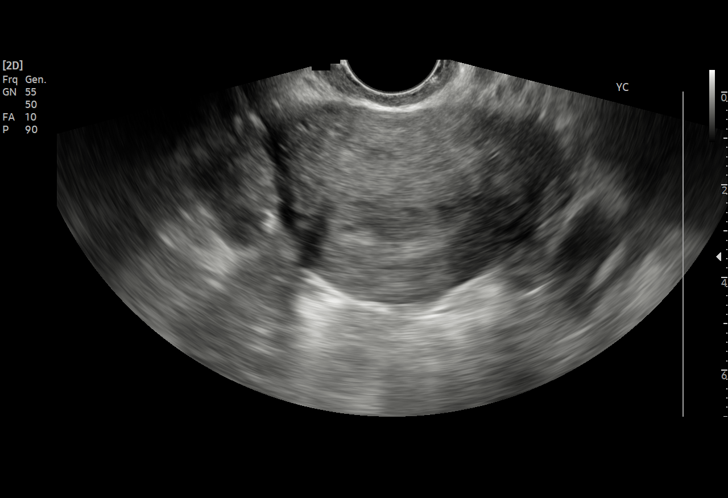
[im 27/49]
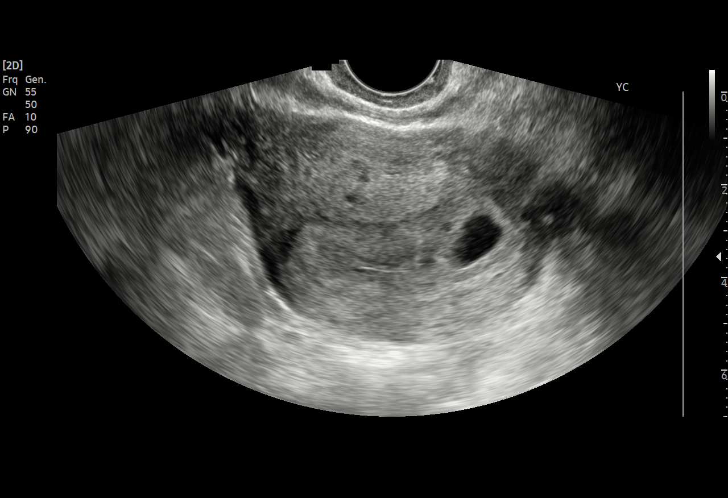
[im 31/49]
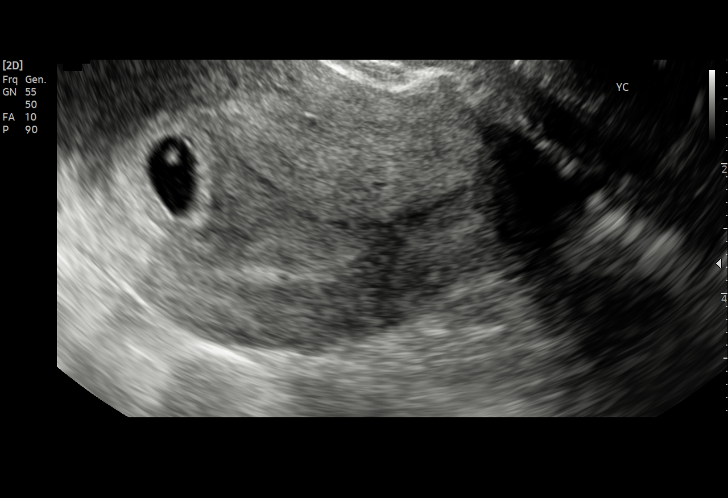
[im 34/49]
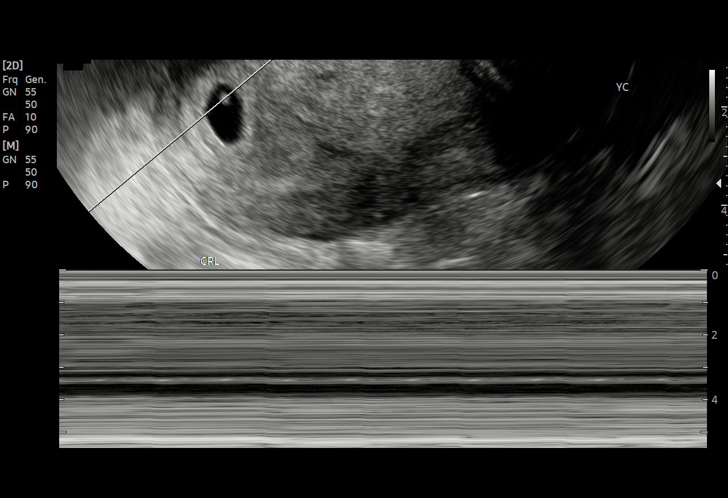
[im 38/49]
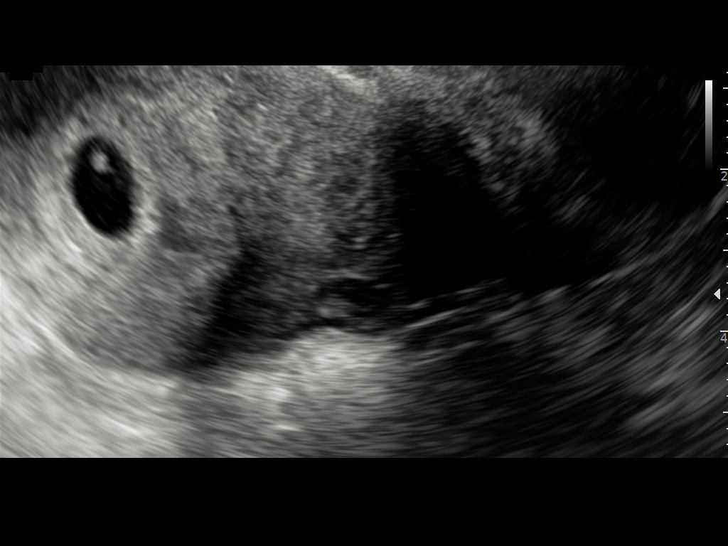
[im 41/49]
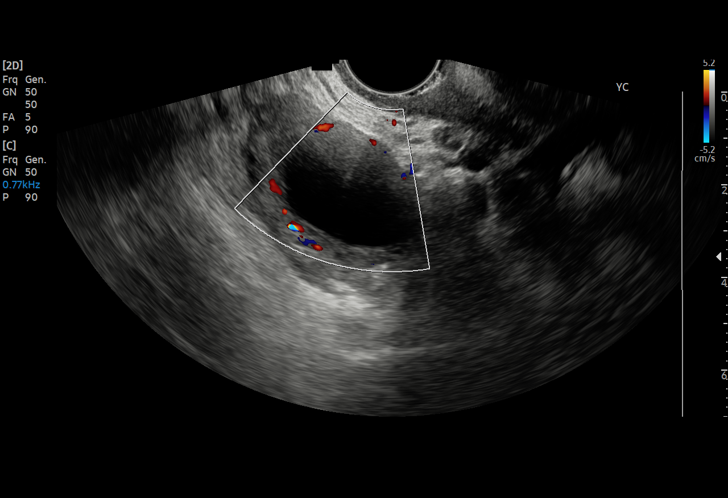
[im 45/49]
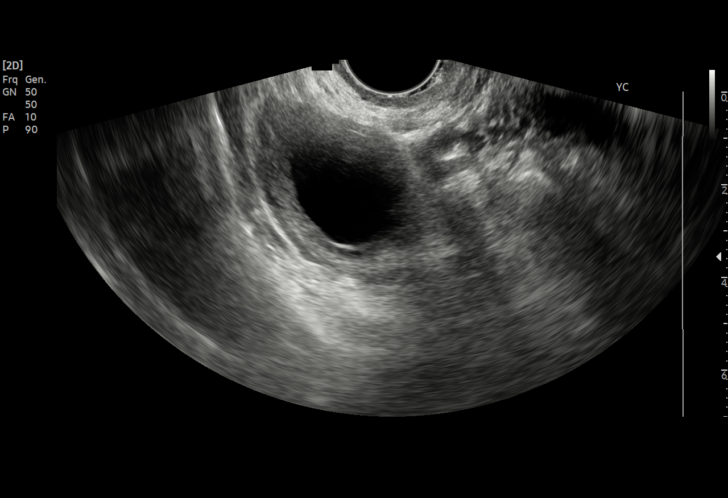
[im 49/49]
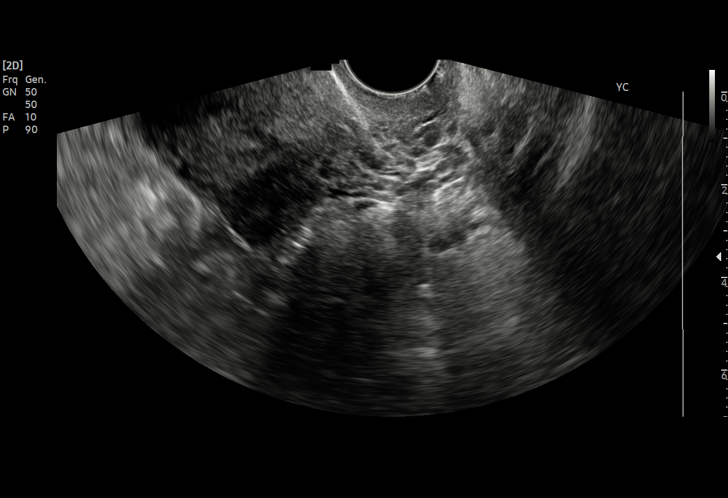

[15 of 28 positions shown; findings below may reference images not displayed]

FINDINGS: Intrauterine gestational sac: Present, single. The gestational sac
is seen within the lateral, superior aspect of the endometrial
cavity compatible with an angular pregnancy.

Yolk sac:  Present, single, normal appearing

Embryo:  Present, single

Cardiac Activity: Present, regular

Heart Rate: 95 bpm

MSD: Appropriate given fetal size

CRL:  4 mm   6 w   1 d                  US EDC: 02/18/2022

Subchorionic hemorrhage:  None visualized.

Maternal uterus/adnexae: The uterus is anteverted. No intrauterine
masses are seen. The cervix is not specifically evaluated on on this
exam. The left ovary is not visualized. A corpus luteum is
identified within the right ovary. No free fluid is seen within the
cul-de-sac.
IMPRESSION: Single living intrauterine gestation with an estimated gestational
age of 6 weeks, 1 day.

Gestational sac position within the superolateral aspect of the
endometrial cavity, which appears normal in configuration,
compatible with an angular pregnancy.

## 2023-03-03 ENCOUNTER — Encounter (HOSPITAL_BASED_OUTPATIENT_CLINIC_OR_DEPARTMENT_OTHER): Payer: Self-pay

## 2023-03-03 ENCOUNTER — Emergency Department (HOSPITAL_BASED_OUTPATIENT_CLINIC_OR_DEPARTMENT_OTHER)
Admission: EM | Admit: 2023-03-03 | Discharge: 2023-03-03 | Disposition: A | Discharge status: Home / Self Care | Payer: Commercial Managed Care - PPO | Attending: Emergency Medicine | Admitting: Emergency Medicine

## 2023-03-03 ENCOUNTER — Other Ambulatory Visit: Payer: Self-pay

## 2023-03-03 DIAGNOSIS — R197 Diarrhea, unspecified: Secondary | ICD-10-CM | POA: Diagnosis present

## 2023-03-03 LAB — COMPREHENSIVE METABOLIC PANEL
ALT: 10 U/L (ref 0–44)
AST: 11 U/L — ABNORMAL LOW (ref 15–41)
Albumin: 4.5 g/dL (ref 3.5–5.0)
Alkaline Phosphatase: 67 U/L (ref 38–126)
Anion gap: 9 (ref 5–15)
BUN: 12 mg/dL (ref 6–20)
CO2: 22 mmol/L (ref 22–32)
Calcium: 9.3 mg/dL (ref 8.9–10.3)
Chloride: 105 mmol/L (ref 98–111)
Creatinine, Ser: 0.54 mg/dL (ref 0.44–1.00)
GFR, Estimated: >60 mL/min (ref >60)
Glucose, Bld: 96 mg/dL (ref 70–99)
Potassium: 3.9 mmol/L (ref 3.5–5.1)
Sodium: 136 mmol/L (ref 135–145)
Total Bilirubin: 0.6 mg/dL (ref 0.3–1.2)
Total Protein: 7.7 g/dL (ref 6.5–8.1)

## 2023-03-03 LAB — URINALYSIS, ROUTINE W REFLEX MICROSCOPIC
Bilirubin Urine: NEGATIVE
Glucose, UA: NEGATIVE mg/dL
Ketones, ur: NEGATIVE mg/dL
Leukocytes,Ua: NEGATIVE
Nitrite: NEGATIVE
Protein, ur: 30 mg/dL — AB
Specific Gravity, Urine: 1.035 — ABNORMAL HIGH (ref 1.005–1.030)
pH: 5.5 (ref 5.0–8.0)

## 2023-03-03 LAB — CBC
HCT: 34.4 % — ABNORMAL LOW (ref 36.0–46.0)
Hemoglobin: 11.6 g/dL — ABNORMAL LOW (ref 12.0–15.0)
MCH: 30.9 pg (ref 26.0–34.0)
MCHC: 33.7 g/dL (ref 30.0–36.0)
MCV: 91.7 fL (ref 80.0–100.0)
Platelets: 221 10*3/uL (ref 150–400)
RBC: 3.75 MIL/uL — ABNORMAL LOW (ref 3.87–5.11)
RDW: 13.1 % (ref 11.5–15.5)
WBC: 6.3 10*3/uL (ref 4.0–10.5)
nRBC: 0 % (ref 0.0–0.2)

## 2023-03-03 LAB — PREGNANCY, URINE: Preg Test, Ur: POSITIVE — AB

## 2023-03-03 LAB — LIPASE, BLOOD: Lipase: 24 U/L (ref 11–51)

## 2023-03-03 MED ORDER — LOPERAMIDE HCL 2 MG PO CAPS
4.0000 mg | ORAL_CAPSULE | Freq: Once | ORAL | Status: AC
  Administered 2023-03-03: 4 mg via ORAL
  Filled 2023-03-03: qty 2

## 2023-03-03 MED ORDER — LIDOCAINE 5 % EX OINT: 1.0000 | TOPICAL_OINTMENT | CUTANEOUS | 0 refills | Status: AC | PRN

## 2023-03-03 MED ORDER — LOPERAMIDE HCL 2 MG PO CAPS: 2.0000 mg | ORAL_CAPSULE | Freq: Four times a day (QID) | ORAL | 0 refills | Status: AC | PRN

## 2023-03-03 NOTE — Discharge Instructions (Signed)
You were evaluated in the Emergency Department and after careful evaluation, we did not find any emergent condition requiring admission or further testing in the hospital.  Your exam/testing today was overall reassuring.  Use the Imodium as needed for the diarrhea.  Use the lidocaine ointment to soothe the irritated area as needed.  Please return to the Emergency Department if you experience any worsening of your condition.  Thank you for allowing [place] to be a part of your care.

## 2023-03-03 NOTE — ED Provider Notes (Signed)
[facility] EMERGENCY [facility] Emergency Department Provider Note MRN:  [id number]  Arrival date & time: 03/03/23     Chief Complaint   Diarrhea   History of Present Illness   Robin Nelson is a 27 y.o. year-old female with no pertinent past medical history presenting to the ED with chief complaint of diarrhea.  Watery diarrhea profusely over the past 45 minutes.  Her anus is sore because of all of the diarrhea.  She does not know what to do.  Came here by ambulance.  Review of Systems  A thorough review of systems was obtained and all systems are negative except as noted in the HPI and PMH.   Patient's Health History   History reviewed. No pertinent past medical history.  History reviewed. No pertinent surgical history.  History reviewed. No pertinent family history.  Social History   Socioeconomic History   Marital status: Single    Spouse name: Not on file   Number of children: Not on file   Years of education: Not on file   Highest education level: Not on file  Occupational History   Not on file  Tobacco Use   Smoking status: Never    Passive exposure: Never   Smokeless tobacco: Never  Vaping Use   Vaping Use: Never used  Substance and Sexual Activity   Alcohol use: Not Currently   Drug use: Not Currently   Sexual activity: Not Currently  Other Topics Concern   Not on file  Social History Narrative   Not on file   Social Determinants of Health   Financial Resource Strain: Not on file  Food Insecurity: Not on file  Transportation Needs: Not on file  Physical Activity: Not on file  Stress: Not on file  Social Connections: Not on file  Intimate Partner Violence: Not on file     Physical Exam   Vitals:   03/03/23 2101 03/03/23 2102  BP: 133/81   Pulse: 78   Resp: 18   Temp:  97.9 F (36.6 C)  SpO2: 100%     CONSTITUTIONAL: Well-appearing, NAD NEURO/PSYCH:  Alert and oriented x 3, no focal deficits EYES:  eyes equal and reactive ENT/NECK:   no LAD, no JVD CARDIO: Regular rate, well-perfused, normal S1 and S2 PULM:  CTAB no wheezing or rhonchi GI/GU:  non-distended, non-tender MSK/SPINE:  No gross deformities, no edema SKIN:  no rash, atraumatic   *Additional and/or pertinent findings included in MDM below  Diagnostic and Interventional Summary    EKG Interpretation  Date/Time:    Ventricular Rate:    PR Interval:    QRS Duration:   QT Interval:    QTC Calculation:   R Axis:     Text Interpretation:         Labs Reviewed  COMPREHENSIVE METABOLIC PANEL - Abnormal; Notable for the following components:      Result Value   AST 11 (*)    All other components within normal limits  CBC - Abnormal; Notable for the following components:   RBC 3.75 (*)    Hemoglobin 11.6 (*)    HCT 34.4 (*)    All other components within normal limits  URINALYSIS, ROUTINE W REFLEX MICROSCOPIC - Abnormal; Notable for the following components:   Specific Gravity, Urine 1.035 (*)    Hgb urine dipstick LARGE (*)    Protein, ur 30 (*)    Bacteria, UA RARE (*)    All other components within normal limits  PREGNANCY, URINE -  Abnormal; Notable for the following components:   Preg Test, Ur POSITIVE (*)    All other components within normal limits  LIPASE, BLOOD    No orders to display    Medications  loperamide (IMODIUM) capsule 4 mg (has no administration in time range)     Procedures  /  Critical Care Procedures  ED Course and Medical Decision Making  Initial Impression and Ddx Diarrhea over the past hour, acute onset.  No abdominal pain, no tenderness on exam, normal vital signs.  Had a D&C abortion a few days ago and so her beta-hCG is still positive.  Soreness from all of the diarrhea.  Past medical/surgical history that increases complexity of ED encounter: None  Interpretation of Diagnostics Laboratory and/or imaging options to aid in the diagnosis/care of the patient were considered.  After careful history and  physical examination, it was determined that there was no indication for diagnostics at this time.  Patient Reassessment and Ultimate Disposition/Management     Discharge as there is no emergent process present.  Will help her with the symptoms.  Patient management required discussion with the following services or consulting groups:  None  Complexity of Problems Addressed Acute complicated illness or Injury  Additional Data Reviewed and Analyzed Further history obtained from: None  Additional Factors Impacting ED Encounter Risk Prescriptions  Elmer Sow. Pilar Plate, MD [facility] Emergency Medicine [facility] [email or web address]  Final Clinical Impressions(s) / ED Diagnoses     ICD-10-CM   1. Diarrhea of presumed infectious origin  R19.7       ED Discharge Orders          Ordered    loperamide (IMODIUM) 2 MG capsule  4 times daily PRN        03/03/23 2312    lidocaine (XYLOCAINE) 5 % ointment  As needed        03/03/23 2312             Discharge Instructions Discussed with and Provided to Patient:    Discharge Instructions      You were evaluated in the Emergency Department and after careful evaluation, we did not find any emergent condition requiring admission or further testing in the hospital.  Your exam/testing today was overall reassuring.  Use the Imodium as needed for the diarrhea.  Use the lidocaine ointment to soothe the irritated area as needed.  Please return to the Emergency Department if you experience any worsening of your condition.  Thank you for allowing [place] to be a part of your care.       Sabas Sous, MD 03/03/23 [phone]

## 2023-03-03 NOTE — ED Triage Notes (Signed)
Patient BIB [facility] from Home.  Endorses Diarrhea, multiple instances of, since this AM today. Lower ABD Pain. No N/V. No Fevers.   Recent D&C Saturday.   NAD Noted during Triage. A&Ox4. Gcs 15. BIB Wheelchair.

## 2024-03-23 ENCOUNTER — Inpatient Hospital Stay (HOSPITAL_COMMUNITY)
Admission: AD | Admit: 2024-03-23 | Discharge: 2024-03-23 | Disposition: A | Discharge status: Home / Self Care | Attending: Obstetrics and Gynecology | Admitting: Obstetrics and Gynecology

## 2024-03-23 ENCOUNTER — Encounter (HOSPITAL_COMMUNITY): Payer: Self-pay

## 2024-03-23 ENCOUNTER — Other Ambulatory Visit: Payer: Self-pay

## 2024-03-23 DIAGNOSIS — Z3A01 Less than 8 weeks gestation of pregnancy: Secondary | ICD-10-CM | POA: Diagnosis not present

## 2024-03-23 DIAGNOSIS — O3680X0 Pregnancy with inconclusive fetal viability, not applicable or unspecified: Secondary | ICD-10-CM

## 2024-03-23 DIAGNOSIS — O209 Hemorrhage in early pregnancy, unspecified: Secondary | ICD-10-CM | POA: Diagnosis not present

## 2024-03-23 LAB — CBC
HCT: 34.6 % — ABNORMAL LOW (ref 36.0–46.0)
Hemoglobin: 11.7 g/dL — ABNORMAL LOW (ref 12.0–15.0)
MCH: 30.6 pg (ref 26.0–34.0)
MCHC: 33.8 g/dL (ref 30.0–36.0)
MCV: 90.6 fL (ref 80.0–100.0)
Platelets: 255 10*3/uL (ref 150–400)
RBC: 3.82 MIL/uL — ABNORMAL LOW (ref 3.87–5.11)
RDW: 12.5 % (ref 11.5–15.5)
WBC: 5 10*3/uL (ref 4.0–10.5)
nRBC: 0 % (ref 0.0–0.2)

## 2024-03-23 LAB — COMPREHENSIVE METABOLIC PANEL WITH GFR
ALT: 11 U/L (ref 0–44)
AST: 18 U/L (ref 15–41)
Albumin: 3.9 g/dL (ref 3.5–5.0)
Alkaline Phosphatase: 59 U/L (ref 38–126)
Anion gap: 9 (ref 5–15)
BUN: 9 mg/dL (ref 6–20)
CO2: 22 mmol/L (ref 22–32)
Calcium: 9.2 mg/dL (ref 8.9–10.3)
Chloride: 109 mmol/L (ref 98–111)
Creatinine, Ser: 0.57 mg/dL (ref 0.44–1.00)
GFR, Estimated: >60 mL/min (ref >60)
Glucose, Bld: 100 mg/dL — ABNORMAL HIGH (ref 70–99)
Potassium: 3.8 mmol/L (ref 3.5–5.1)
Sodium: 140 mmol/L (ref 135–145)
Total Bilirubin: 0.7 mg/dL (ref 0.0–1.2)
Total Protein: 7.5 g/dL (ref 6.5–8.1)

## 2024-03-23 LAB — HCG, QUANTITATIVE, PREGNANCY: hCG, Beta Chain, Quant, S: 17 m[IU]/mL — ABNORMAL HIGH (ref <5)

## 2024-03-23 LAB — POCT PREGNANCY, URINE: Preg Test, Ur: NEGATIVE

## 2024-03-23 NOTE — MAU Note (Signed)
 Robin Nelson is a 28 y.o. at [redacted]w[redacted]d here in MAU reporting: "I'm pretty sure I'm having a miscarriage." Noted vaginal bleeding starting Saturday with cramps starting Sunday that have since resolved.   Onset of complaint: Saturday Pain score: 0/10 Vitals:   03/23/24 1013  BP: 125/69  Pulse: 80  Resp: 18  Temp: 98.3 F (36.8 C)  SpO2: 100%      Lab orders placed from triage: UA

## 2024-03-23 NOTE — MAU Provider Note (Signed)
 Chief Complaint: Vaginal Bleeding  SUBJECTIVE HPI: Robin Nelson is a 28 y.o. G3P1011 at Unknown by LMP who presents to maternity admissions reporting vaginal bleeding starting Saturday. "I'm pretty sure I'm having a miscarriage". No pain.   She denies vaginal bleeding, vaginal itching/burning, urinary symptoms, h/a, dizziness, n/v, or fever/chills.    HPI  No past medical history on file. No past surgical history on file. Social History   Socioeconomic History   Marital status: Single    Spouse name: Not on file   Number of children: Not on file   Years of education: Not on file   Highest education level: Not on file  Occupational History   Not on file  Tobacco Use   Smoking status: Never    Passive exposure: Never   Smokeless tobacco: Never  Vaping Use   Vaping status: Never Used  Substance and Sexual Activity   Alcohol use: Not Currently   Drug use: Not Currently   Sexual activity: Not Currently  Other Topics Concern   Not on file  Social History Narrative   Not on file   Social Drivers of Health   Financial Resource Strain: Not on file  Food Insecurity: Not on file  Transportation Needs: Not on file  Physical Activity: Not on file  Stress: Not on file  Social Connections: Unknown (03/18/2022)   Received from [facility], [facility]   Social Network    Social Network: Not on file  Intimate Partner Violence: Unknown (02/07/2022)   Received from [facility], [facility]   HITS    Physically Hurt: Not on file    Insult or Talk Down To: Not on file    Threaten Physical Harm: Not on file    Scream or Curse: Not on file   No current facility-administered medications on file prior to encounter.   Current Outpatient Medications on File Prior to Encounter  Medication Sig Dispense Refill   ferrous sulfate  325 (65 FE) MG tablet Take 1 tablet (325 mg total) by mouth 2 (two) times daily with a meal. 60 tablet 6   ibuprofen  (ADVIL ) 600 MG tablet Take 1 tablet  (600 mg total) by mouth every 6 (six) hours as needed. 30 tablet 11   lidocaine  (XYLOCAINE ) 5 % ointment Apply 1 Application topically as needed. 35.44 g 0   loperamide  (IMODIUM ) 2 MG capsule Take 1 capsule (2 mg total) by mouth 4 (four) times daily as needed for diarrhea or loose stools. 12 capsule 0   No Known Allergies  ROS:  Pertinent positives/negatives listed above.  I have reviewed patient's Past Medical Hx, Surgical Hx, Family Hx, Social Hx, medications and allergies.   Physical Exam  Patient Vitals for the past 24 hrs:  BP Temp Temp src Pulse Resp SpO2  03/23/24 1013 125/69 98.3 F (36.8 C) Oral 80 18 100 %   Constitutional: Well-developed, well-nourished female in no acute distress.  Cardiovascular: normal rate Respiratory: normal effort GI: Abd soft, non-tender. Pos BS x 4 MS: Extremities nontender, no edema, normal ROM Neurologic: Alert and oriented x 4.  GU: Neg CVAT.   LAB RESULTS Results for orders placed or performed during the hospital encounter of 03/23/24 (from the past 24 hours)  Pregnancy, urine POC     Status: None   Collection Time: 03/23/24  9:49 AM  Result Value Ref Range   Preg Test, Ur NEGATIVE NEGATIVE  CBC     Status: Abnormal   Collection Time: 03/23/24 10:25 AM  Result  Value Ref Range   WBC 5.0 4.0 - 10.5 K/uL   RBC 3.82 (L) 3.87 - 5.11 MIL/uL   Hemoglobin 11.7 (L) 12.0 - 15.0 g/dL   HCT 16.1 (L) 09.6 - 04.5 %   MCV 90.6 80.0 - 100.0 fL   MCH 30.6 26.0 - 34.0 pg   MCHC 33.8 30.0 - 36.0 g/dL   RDW 40.9 81.1 - 91.4 %   Platelets 255 150 - 400 K/uL   nRBC 0.0 0.0 - 0.2 %  Comprehensive metabolic panel with GFR     Status: Abnormal   Collection Time: 03/23/24 10:25 AM  Result Value Ref Range   Sodium 140 135 - 145 mmol/L   Potassium 3.8 3.5 - 5.1 mmol/L   Chloride 109 98 - 111 mmol/L   CO2 22 22 - 32 mmol/L   Glucose, Bld 100 (H) 70 - 99 mg/dL   BUN 9 6 - 20 mg/dL   Creatinine, Ser 7.82 0.44 - 1.00 mg/dL   Calcium 9.2 8.9 - 95.6 mg/dL    Total Protein 7.5 6.5 - 8.1 g/dL   Albumin 3.9 3.5 - 5.0 g/dL   AST 18 15 - 41 U/L   ALT 11 0 - 44 U/L   Alkaline Phosphatase 59 38 - 126 U/L   Total Bilirubin 0.7 0.0 - 1.2 mg/dL   GFR, Estimated >21 >30 mL/min   Anion gap 9 5 - 15  hCG, quantitative, pregnancy     Status: Abnormal   Collection Time: 03/23/24 10:25 AM  Result Value Ref Range   hCG, Beta Chain, Quant, S 17 (H) <5 mIU/mL       IMAGING No results found.  MAU Management/MDM: Orders Placed This Encounter  Procedures   CBC   Comprehensive metabolic panel with GFR   hCG, quantitative, pregnancy   Pregnancy, urine POC   Discharge patient Discharge disposition: 01-Home or Self Care; Discharge patient date: 03/23/2024    No orders of the defined types were placed in this encounter.    ASSESSMENT 1. [redacted] weeks gestation of pregnancy   2. Pregnancy of unknown anatomic location   3. Vaginal bleeding in pregnancy, first trimester   UPT negative, bHCG 17 Recommend 48 hour follow up for repeat bHCG  PLAN Discharge home with strict return precautions.  Allergies as of 03/23/2024   No Known Allergies      Medication List     STOP taking these medications    ibuprofen  600 MG tablet Commonly known as: ADVIL        TAKE these medications    ferrous sulfate  325 (65 FE) MG tablet Take 1 tablet (325 mg total) by mouth 2 (two) times daily with a meal.   lidocaine  5 % ointment Commonly known as: XYLOCAINE  Apply 1 Application topically as needed.   loperamide  2 MG capsule Commonly known as: IMODIUM  Take 1 capsule (2 mg total) by mouth 4 (four) times daily as needed for diarrhea or loose stools.        Follow-up Information     [facility] Follow up.   Specialty: Obstetrics and Gynecology Why: As needed for pregnancy emergencies Contact information: [address] [place]  [phone]               [place], MD FMOB Fellow, Faculty  practice [facility], Center for [facility]  03/23/2024  11:35 AM

## 2024-03-25 ENCOUNTER — Inpatient Hospital Stay (HOSPITAL_COMMUNITY)
Admission: AD | Admit: 2024-03-25 | Discharge: 2024-03-25 | Disposition: A | Discharge status: Home / Self Care | Attending: Obstetrics and Gynecology | Admitting: Obstetrics and Gynecology

## 2024-03-25 ENCOUNTER — Other Ambulatory Visit: Payer: Self-pay

## 2024-03-25 DIAGNOSIS — O0281 Inappropriate change in quantitative human chorionic gonadotropin (hCG) in early pregnancy: Secondary | ICD-10-CM | POA: Diagnosis not present

## 2024-03-25 DIAGNOSIS — R7989 Other specified abnormal findings of blood chemistry: Secondary | ICD-10-CM | POA: Diagnosis not present

## 2024-03-25 DIAGNOSIS — R519 Headache, unspecified: Secondary | ICD-10-CM | POA: Diagnosis present

## 2024-03-25 DIAGNOSIS — N939 Abnormal uterine and vaginal bleeding, unspecified: Secondary | ICD-10-CM | POA: Diagnosis not present

## 2024-03-25 DIAGNOSIS — Z3201 Encounter for pregnancy test, result positive: Secondary | ICD-10-CM | POA: Diagnosis present

## 2024-03-25 LAB — HCG, QUANTITATIVE, PREGNANCY: hCG, Beta Chain, Quant, S: 6 m[IU]/mL — ABNORMAL HIGH (ref <5)

## 2024-03-25 NOTE — MAU Note (Signed)
 Robin Nelson is a 28 y.o. at [redacted]w[redacted]d here in MAU reporting: she's here for follow up HCG level.  Denies current pain, states VB has decreased from moderate to light.  States she's getting HA's after work day.  LMP: 11/18/2023 Onset of complaint: ongoing Pain score: 0 Vitals:   03/25/24 0840  BP: 132/83  Pulse: 62  Resp: 19  Temp: 98.2 F (36.8 C)  SpO2: 100%     FHT: NA  Lab orders placed from triage: HCG

## 2024-03-25 NOTE — MAU Provider Note (Signed)
 S Ms. Robin Nelson is a 28 y.o. [phone] patient who presents to MAU today with complaint of here for repeat HCG. Last seen on 03/23/24 with an HCG 17. Patient offers no c/o other than vaginal bleeding that is light and mild HA's resolved by Tylenol .   O BP 132/83 (BP Location: Right Arm)   Pulse 62   Temp 98.2 F (36.8 C) (Oral)   Resp 19   Ht 5' 6.5" (1.689 m)   Wt 82.4 kg   LMP 02/11/2024   SpO2 100%   BMI 28.87 kg/m  Physical Exam Vitals and nursing note reviewed.  Constitutional:      General: She is not in acute distress.    Appearance: Normal appearance. She is not ill-appearing.  HENT:     Head: Normocephalic.     Nose: Nose normal.     Mouth/Throat:     Mouth: Mucous membranes are moist.  Cardiovascular:     Rate and Rhythm: Normal rate.  Pulmonary:     Effort: Pulmonary effort is normal.  Abdominal:     Palpations: Abdomen is soft.  Musculoskeletal:        General: Normal range of motion.     Cervical back: Normal range of motion.  Skin:    General: Skin is warm.  Neurological:     Mental Status: She is alert and oriented to person, place, and time.  Psychiatric:        Mood and Affect: Mood normal.        Behavior: Behavior normal.   Pelvic: Deferred   MDM  Moderate Vaginal bleeding with elevated hcg quant for repeat HCG level Basic PE HCG levels dropped (Advised patient will need one additional HCG in 1 week till <5 or negative result)  Lab Results  Component Value Date   HCGBETAQNT 6 (H) 03/25/2024   HCGBETAQNT 17 (H) 03/23/2024     Orders Placed This Encounter  Procedures   hCG, quantitative, pregnancy    Standing Status:   Standing    Number of Occurrences:   1   Discharge patient Discharge disposition: 01-Home or Self Care; Discharge patient date: 03/25/2024    Standing Status:   Standing    Number of Occurrences:   1    Discharge disposition:   01-Home or Self Care [1]    Discharge patient date:   03/25/2024      Results for  orders placed or performed during the hospital encounter of 03/25/24 (from the past 24 hours)  hCG, quantitative, pregnancy     Status: Abnormal   Collection Time: 03/25/24  8:51 AM  Result Value Ref Range   hCG, Beta Chain, Quant, S 6 (H) <5 mIU/mL      I have reviewed the patient chart and performed the physical exam  A/P as described below.  Counseling and education provided and patient agreeable  with plan as described below. Verbalized understanding.    ASSESSMENT Medical screening exam complete  1. Elevated serum hCG (Primary)  2. Vaginal bleeding    PLAN Future Appointments  Date Time Provider Department Center  04/01/2024 10:00 AM [facility]    Discharge from MAU in stable condition  See AVS for full description of educational information and instructions provided to the patient at time of discharge  List of options for follow-up given   Warning signs for worsening condition that would warrant emergency follow-up discussed Patient may return to MAU as needed   Zalma Channing, Darren Em, NP  03/25/2024 10:13 AM

## 2024-03-28 ENCOUNTER — Other Ambulatory Visit: Payer: Self-pay

## 2024-03-28 DIAGNOSIS — K0889 Other specified disorders of teeth and supporting structures: Secondary | ICD-10-CM | POA: Diagnosis present

## 2024-03-29 ENCOUNTER — Telehealth (HOSPITAL_BASED_OUTPATIENT_CLINIC_OR_DEPARTMENT_OTHER): Payer: Self-pay | Admitting: Emergency Medicine

## 2024-03-29 ENCOUNTER — Emergency Department (HOSPITAL_BASED_OUTPATIENT_CLINIC_OR_DEPARTMENT_OTHER)
Admission: EM | Admit: 2024-03-29 | Discharge: 2024-03-29 | Disposition: A | Discharge status: Home / Self Care | Attending: Emergency Medicine | Admitting: Emergency Medicine

## 2024-03-29 ENCOUNTER — Encounter (HOSPITAL_BASED_OUTPATIENT_CLINIC_OR_DEPARTMENT_OTHER): Payer: Self-pay | Admitting: Emergency Medicine

## 2024-03-29 DIAGNOSIS — K0889 Other specified disorders of teeth and supporting structures: Secondary | ICD-10-CM

## 2024-03-29 MED ORDER — AMOXICILLIN 500 MG PO CAPS: 500.0000 mg | ORAL_CAPSULE | Freq: Three times a day (TID) | ORAL | 0 refills | Status: AC

## 2024-03-29 MED ORDER — AMOXICILLIN 500 MG PO CAPS: 500.0000 mg | ORAL_CAPSULE | Freq: Three times a day (TID) | ORAL | 0 refills | Status: DC

## 2024-03-29 MED ORDER — NAPROXEN 500 MG PO TABS: 500.0000 mg | ORAL_TABLET | Freq: Two times a day (BID) | ORAL | 0 refills | Status: AC

## 2024-03-29 MED ORDER — AMOXICILLIN 500 MG PO CAPS
500.0000 mg | ORAL_CAPSULE | Freq: Once | ORAL | Status: AC
  Administered 2024-03-29: 500 mg via ORAL
  Filled 2024-03-29: qty 1

## 2024-03-29 MED ORDER — HYDROCODONE-ACETAMINOPHEN 5-325 MG PO TABS
1.0000 | ORAL_TABLET | Freq: Once | ORAL | Status: AC
  Administered 2024-03-29: 1 via ORAL
  Filled 2024-03-29: qty 1

## 2024-03-29 MED ORDER — NAPROXEN 250 MG PO TABS
500.0000 mg | ORAL_TABLET | Freq: Once | ORAL | Status: AC
  Administered 2024-03-29: 500 mg via ORAL
  Filled 2024-03-29: qty 2

## 2024-03-29 MED ORDER — NAPROXEN 500 MG PO TABS: 500.0000 mg | ORAL_TABLET | Freq: Two times a day (BID) | ORAL | 0 refills | Status: DC

## 2024-03-29 NOTE — ED Triage Notes (Signed)
  Patient comes in with dental pain that has been going on for a couple days.  Patient states R side lower and upper molars are tender and throbbing.  Endorses pain when eating or drinking something too hot/cold.  Taking ibuprofen  and salt water rinses. Last dose around 1900, 400 mg.

## 2024-03-29 NOTE — Discharge Instructions (Addendum)
 You were evaluated in the Emergency Department and after careful evaluation, we did not find any emergent condition requiring admission or further testing in the hospital.  Your exam/testing today is overall reassuring.  Symptoms likely due to a tooth infection, possibly related to an impacted wisdom tooth.  Recommend close follow-up with a dentist.  Take the amoxicillin antibiotic as directed.  Use the Naprosyn twice daily as needed for pain.  Please return to the Emergency Department if you experience any worsening of your condition.   Thank you for allowing us  to be a part of your care.

## 2024-03-29 NOTE — ED Provider Notes (Signed)
 [facility] Emergency Department Provider Note MRN:  [id number]  Arrival date & time: 03/29/24     Chief Complaint   Dental Pain   History of Present Illness   Robin Nelson is a 28 y.o. year-old female with no pertinent past medical history presenting to the ED with chief complaint of dental pain.  Pain to the right lower teeth with pain spreading to the cheek area, pain radiating to the ear area.  No fever.  Review of Systems  A thorough review of systems was obtained and all systems are negative except as noted in the HPI and PMH.   Patient's Health History   History reviewed. No pertinent past medical history.  History reviewed. No pertinent surgical history.  History reviewed. No pertinent family history.  Social History   Socioeconomic History   Marital status: Single    Spouse name: Not on file   Number of children: Not on file   Years of education: Not on file   Highest education level: Not on file  Occupational History   Not on file  Tobacco Use   Smoking status: Never    Passive exposure: Never   Smokeless tobacco: Never  Vaping Use   Vaping status: Never Used  Substance and Sexual Activity   Alcohol use: Not Currently   Drug use: Not Currently   Sexual activity: Not Currently  Other Topics Concern   Not on file  Social History Narrative   Not on file   Social Drivers of Health   Financial Resource Strain: Not on file  Food Insecurity: Not on file  Transportation Needs: Not on file  Physical Activity: Not on file  Stress: Not on file  Social Connections: Unknown (03/18/2022)   Received from [facility], [facility]   Social Network    Social Network: Not on file  Intimate Partner Violence: Unknown (02/07/2022)   Received from [employer], [facility]   HITS    Physically Hurt: Not on file    Insult or Talk Down To: Not on file    Threaten Physical Harm: Not on file    Scream or Curse: Not on file     Physical  Exam   Vitals:   03/29/24 0001  BP: 129/80  Pulse: 86  Resp: 20  Temp: (!) 97.3 F (36.3 C)  SpO2: 100%    CONSTITUTIONAL: Well-appearing, NAD NEURO/PSYCH:  Alert and oriented x 3, no focal deficits EYES:  eyes equal and reactive ENT/NECK:  no LAD, no JVD CARDIO: Regular rate, well-perfused, normal S1 and S2 PULM:  CTAB no wheezing or rhonchi GI/GU:  non-distended, non-tender MSK/SPINE:  No gross deformities, no edema SKIN:  no rash, atraumatic   *Additional and/or pertinent findings included in MDM below  Diagnostic and Interventional Summary    EKG Interpretation Date/Time:    Ventricular Rate:    PR Interval:    QRS Duration:    QT Interval:    QTC Calculation:   R Axis:      Text Interpretation:         Labs Reviewed - No data to display  No orders to display    Medications  amoxicillin (AMOXIL) capsule 500 mg (500 mg Oral Given 03/29/24 0130)  HYDROcodone-acetaminophen  (NORCO/VICODIN) 5-325 MG per tablet 1 tablet (1 tablet Oral Given 03/29/24 0130)  naproxen (NAPROSYN) tablet 500 mg (500 mg Oral Given 03/29/24 0130)     Procedures  /  Critical Care Procedures  ED Course and Medical  Decision Making  Initial Impression and Ddx Erythema to the gingiva surrounding right lower wisdom tooth.  No signs of abscess or more significant contiguous infection.  Looks like an impacted wisdom tooth causing inflammation, possibly infection.  Past medical/surgical history that increases complexity of ED encounter: None  Interpretation of Diagnostics Laboratory and/or imaging options to aid in the diagnosis/care of the patient were considered.  After careful history and physical examination, it was determined that there was no indication for diagnostics at this time.  Patient Reassessment and Ultimate Disposition/Management     Discharge  Patient management required discussion with the following services or consulting groups:  None  Complexity of Problems  Addressed Acute complicated illness or Injury  Additional Data Reviewed and Analyzed Further history obtained from: None  Additional Factors Impacting ED Encounter Risk Prescriptions  Merrick Abe. Harless Lien, MD [facility] Emergency Medicine [facility] [email or web address]  Final Clinical Impressions(s) / ED Diagnoses     ICD-10-CM   1. Pain, dental  K08.89       ED Discharge Orders          Ordered    amoxicillin (AMOXIL) 500 MG capsule  3 times daily        03/29/24 0159    naproxen (NAPROSYN) 500 MG tablet  2 times daily        03/29/24 0159             Discharge Instructions Discussed with and Provided to Patient:   Discharge Instructions      You were evaluated in the Emergency Department and after careful evaluation, we did not find any emergent condition requiring admission or further testing in the hospital.  Your exam/testing today is overall reassuring.  Symptoms likely due to a tooth infection, possibly related to an impacted wisdom tooth.  Recommend close follow-up with a dentist.  Take the amoxicillin antibiotic as directed.  Use the Naprosyn twice daily as needed for pain.  Please return to the Emergency Department if you experience any worsening of your condition.   Thank you for allowing us  to be a part of your care.      Edson Graces, MD 03/29/24 [phone]

## 2024-03-31 ENCOUNTER — Other Ambulatory Visit: Payer: Self-pay

## 2024-03-31 DIAGNOSIS — Z3A01 Less than 8 weeks gestation of pregnancy: Secondary | ICD-10-CM

## 2024-04-01 ENCOUNTER — Other Ambulatory Visit: Payer: Self-pay

## 2024-04-01 ENCOUNTER — Encounter: Payer: Self-pay | Admitting: Family Medicine

## 2024-04-01 DIAGNOSIS — Z3A01 Less than 8 weeks gestation of pregnancy: Secondary | ICD-10-CM

## 2024-04-01 LAB — BETA HCG QUANT (REF LAB): hCG Quant: <2 m[IU]/mL

## 2024-07-16 ENCOUNTER — Encounter (HOSPITAL_COMMUNITY): Payer: Self-pay | Admitting: Obstetrics and Gynecology

## 2024-07-16 ENCOUNTER — Ambulatory Visit (HOSPITAL_COMMUNITY)
Admission: RE | Admit: 2024-07-16 | Discharge: 2024-07-16 | Disposition: A | Discharge status: Home / Self Care | Source: Ambulatory Visit

## 2024-07-16 ENCOUNTER — Inpatient Hospital Stay (HOSPITAL_COMMUNITY)
Admission: AD | Admit: 2024-07-16 | Discharge: 2024-07-16 | Discharge status: Against Medical Advice | Attending: Obstetrics and Gynecology | Admitting: Obstetrics and Gynecology

## 2024-07-16 ENCOUNTER — Telehealth: Payer: Self-pay | Admitting: Obstetrics and Gynecology

## 2024-07-16 ENCOUNTER — Encounter (HOSPITAL_COMMUNITY): Payer: Self-pay

## 2024-07-16 ENCOUNTER — Other Ambulatory Visit: Payer: Self-pay

## 2024-07-16 ENCOUNTER — Encounter: Payer: Self-pay | Admitting: Obstetrics and Gynecology

## 2024-07-16 VITALS — BP 130/77 | HR 78 | Temp 98.9°F | Resp 18 | Ht 66.5 in

## 2024-07-16 DIAGNOSIS — R1031 Right lower quadrant pain: Secondary | ICD-10-CM | POA: Diagnosis present

## 2024-07-16 LAB — WET PREP, GENITAL
Sperm: NONE SEEN
Trich, Wet Prep: NONE SEEN
WBC, Wet Prep HPF POC: <10 (ref <10)
Yeast Wet Prep HPF POC: NONE SEEN

## 2024-07-16 LAB — POCT URINALYSIS DIP (MANUAL ENTRY)
Bilirubin, UA: NEGATIVE
Blood, UA: NEGATIVE
Glucose, UA: NEGATIVE mg/dL
Leukocytes, UA: NEGATIVE
Nitrite, UA: NEGATIVE
Protein Ur, POC: 30 mg/dL — AB
Spec Grav, UA: 1.025 (ref 1.010–1.025)
Urobilinogen, UA: 0.2 U/dL
pH, UA: 6 (ref 5.0–8.0)

## 2024-07-16 LAB — POCT PREGNANCY, URINE: Preg Test, Ur: NEGATIVE

## 2024-07-16 NOTE — Progress Notes (Signed)
 Pt left after triage and prior to being seen by a APP.

## 2024-07-16 NOTE — ED Triage Notes (Addendum)
 Abnormal color just found out I'm pregnant.- Entered by patient.   Patient states she is currently [redacted] weeks pregnant. States the discharge started after unprotected intercourse with her child's father. Onset 8/31. Having low back pain but denies any urinary symptoms. Patient also having right lower abdominal cramping with occasional sharp lower back pain.

## 2024-07-16 NOTE — Discharge Instructions (Addendum)
  1. Right lower quadrant abdominal pain (Primary) - POC urinalysis dipstick completed in UC shows no leukocytes, no nitrite, no blood, no significant sign of urinary tract infection as a potential cause of symptoms. - As discussed recommend follow-up in women Center for further evaluation of right lower quadrant abdominal pain, lower back pain, vaginal discharge during pregnancy. -Continue to monitor symptoms for any change in severity if there is any escalation of current symptoms or development of new symptoms follow-up in ER for further evaluation and management.

## 2024-07-16 NOTE — MAU Note (Signed)
 Robin Nelson is a 28 y.o. at 103w2d here in MAU reporting: she was seen at urgent care earlier today for abnormal discharge, vaginal cultures and urine obtained during visit.  Pt states after leaving urgent care, reviewed MyChart and saw abnormal urine results so came to MAU. Denies VB, endorses pelvic cramping and back pain.  LMP: 06/17/2024 Onset of complaint: today Pain score: 4 Vitals:   07/16/24 1705  BP: 121/71  Pulse: 100  Temp: 98.5 F (36.9 C)  SpO2: 99%     FHT: NA  Lab orders placed from triage: UPT

## 2024-07-16 NOTE — Telephone Encounter (Signed)
 TC to notify patient of wet prep swab. Explained that clue cells alone is not an indication of BV. Will send alternative vaginitis therapies via MyChart message to help alleviate changes in vaginal discharge after SI and menses. Patient verbalized an understanding of the plan of care and agrees.    .Niara Bunker, CNM 07/16/2024 6:34 PM

## 2024-07-16 NOTE — ED Provider Notes (Signed)
 [facility]  Note:  This document was prepared using Dragon voice recognition software and may include unintentional dictation errors.  MRN: [id number] DOB: 1996/10/22  Subjective:   Robin Nelson is a 28 y.o. female presenting for abnormal vaginal discharge, lower abdominal cramping, lower back pain x 2 weeks.  Patient reports that she just found out that she is [redacted] weeks pregnant.  Patient has not taken any over-the-counter medication to treat lower abdominal pain or back pain.  Patient denies any dysuria, increased urinary frequency, urinary hesitancy.   No current facility-administered medications for this encounter.  Current Outpatient Medications:    ferrous sulfate  325 (65 FE) MG tablet, Take 1 tablet (325 mg total) by mouth 2 (two) times daily with a meal., Disp: 60 tablet, Rfl: 6   lidocaine  (XYLOCAINE ) 5 % ointment, Apply 1 Application topically as needed., Disp: 35.44 g, Rfl: 0   loperamide  (IMODIUM ) 2 MG capsule, Take 1 capsule (2 mg total) by mouth 4 (four) times daily as needed for diarrhea or loose stools., Disp: 12 capsule, Rfl: 0   naproxen  (NAPROSYN ) 500 MG tablet, Take 1 tablet (500 mg total) by mouth 2 (two) times daily., Disp: 30 tablet, Rfl: 0   No Known Allergies  History reviewed. No pertinent past medical history.   History reviewed. No pertinent surgical history.  History reviewed. No pertinent family history.  Social History   Tobacco Use   Smoking status: Never    Passive exposure: Never   Smokeless tobacco: Never  Vaping Use   Vaping status: Never Used  Substance Use Topics   Alcohol use: Not Currently   Drug use: Not Currently    ROS Refer to HPI for ROS details.  Objective:    Vitals: BP 130/77 (BP Location: Right Arm)   Pulse 78   Temp 98.9 F (37.2 C) (Oral)   Resp 18   Ht 5' 6.5 (1.689 m)   LMP 02/11/2024   SpO2 99%   Breastfeeding Yes   BMI 28.87 kg/m   Physical Exam Vitals and nursing note  reviewed.  Constitutional:      General: She is not in acute distress.    Appearance: Normal appearance. She is well-developed. She is not ill-appearing or toxic-appearing.  HENT:     Head: Normocephalic and atraumatic.  Cardiovascular:     Rate and Rhythm: Normal rate.  Pulmonary:     Effort: Pulmonary effort is normal. No respiratory distress.  Abdominal:     General: There is no distension.     Palpations: Abdomen is soft.     Tenderness: There is abdominal tenderness. There is no right CVA tenderness or left CVA tenderness.  Genitourinary:    Vagina: Vaginal discharge present.  Skin:    General: Skin is warm and dry.  Neurological:     General: No focal deficit present.     Mental Status: She is alert and oriented to person, place, and time.  Psychiatric:        Mood and Affect: Mood normal.        Behavior: Behavior normal.     Procedures  No results found for this or any previous visit (from the past 24 hours).  Assessment and Plan :     Discharge Instructions       1. Right lower quadrant abdominal pain (Primary) - POC urinalysis dipstick completed in UC shows no leukocytes, no nitrite, no blood, no significant sign of urinary tract infection as a potential cause  of symptoms. - As discussed recommend follow-up in women Center for further evaluation of right lower quadrant abdominal pain, lower back pain, vaginal discharge during pregnancy. -Continue to monitor symptoms for any change in severity if there is any escalation of current symptoms or development of new symptoms follow-up in ER for further evaluation and management.      [place] B Ayiden Milliman   Jacarie Pate, Curtice B, [place] 07/16/24 1307

## 2024-07-19 LAB — GC/CHLAMYDIA PROBE AMP (~~LOC~~) NOT AT ARMC
Chlamydia: NEGATIVE
Comment: NEGATIVE
Comment: NORMAL
Neisseria Gonorrhea: NEGATIVE

## 2024-07-19 LAB — CERVICOVAGINAL ANCILLARY ONLY
Bacterial Vaginitis (gardnerella): POSITIVE — AB
Candida Glabrata: NEGATIVE
Candida Vaginitis: NEGATIVE
Chlamydia: NEGATIVE
Comment: NEGATIVE
Comment: NEGATIVE
Comment: NEGATIVE
Comment: NEGATIVE
Comment: NEGATIVE
Comment: NORMAL
Neisseria Gonorrhea: NEGATIVE
Trichomonas: NEGATIVE

## 2024-07-20 ENCOUNTER — Ambulatory Visit (HOSPITAL_COMMUNITY): Payer: Self-pay

## 2024-07-20 MED ORDER — METRONIDAZOLE 0.75 % VA GEL: 1.0000 | Freq: Every day | VAGINAL | 0 refills | Status: AC
